# Patient Record
Sex: Male | Born: 1975 | Race: White | Hispanic: No | Marital: Married | Smoking: Never smoker
Health system: Southern US, Community
[De-identification: ages and names within clinical notes are randomized; demographics above are authoritative.]

## PROBLEM LIST (undated history)

## (undated) DIAGNOSIS — B019 Varicella without complication: Secondary | ICD-10-CM

## (undated) DIAGNOSIS — G610 Guillain-Barre syndrome: Secondary | ICD-10-CM

## (undated) DIAGNOSIS — R112 Nausea with vomiting, unspecified: Secondary | ICD-10-CM

## (undated) DIAGNOSIS — Z9889 Other specified postprocedural states: Secondary | ICD-10-CM

## (undated) DIAGNOSIS — E785 Hyperlipidemia, unspecified: Secondary | ICD-10-CM

## (undated) HISTORY — DX: Varicella without complication: B01.9

## (undated) HISTORY — DX: Hyperlipidemia, unspecified: E78.5

## (undated) HISTORY — PX: SHOULDER ARTHROSCOPY: SHX128

## (undated) HISTORY — PX: VASECTOMY: SHX75

## (undated) HISTORY — DX: Nausea with vomiting, unspecified: R11.2

## (undated) HISTORY — PX: WISDOM TOOTH EXTRACTION: SHX21

## (undated) HISTORY — PX: OTHER SURGICAL HISTORY: SHX169

## (undated) HISTORY — DX: Guillain-Barre syndrome: G61.0

## (undated) HISTORY — DX: Other specified postprocedural states: Z98.890

---

## 2009-08-07 ENCOUNTER — Ambulatory Visit: Payer: Self-pay | Admitting: Family Medicine

## 2010-03-12 ENCOUNTER — Ambulatory Visit: Payer: Self-pay | Admitting: Family Medicine

## 2010-09-21 ENCOUNTER — Ambulatory Visit (INDEPENDENT_AMBULATORY_CARE_PROVIDER_SITE_OTHER): Payer: Managed Care, Other (non HMO) | Admitting: Family Medicine

## 2010-09-21 DIAGNOSIS — B360 Pityriasis versicolor: Secondary | ICD-10-CM

## 2010-09-21 DIAGNOSIS — Z79899 Other long term (current) drug therapy: Secondary | ICD-10-CM

## 2012-05-21 ENCOUNTER — Ambulatory Visit (INDEPENDENT_AMBULATORY_CARE_PROVIDER_SITE_OTHER): Payer: Managed Care, Other (non HMO) | Admitting: Family Medicine

## 2012-05-21 ENCOUNTER — Encounter: Payer: Self-pay | Admitting: Family Medicine

## 2012-05-21 VITALS — BP 120/82 | HR 79 | Temp 100.3°F | Wt 226.0 lb

## 2012-05-21 DIAGNOSIS — J029 Acute pharyngitis, unspecified: Secondary | ICD-10-CM

## 2012-05-21 DIAGNOSIS — J019 Acute sinusitis, unspecified: Secondary | ICD-10-CM

## 2012-05-21 LAB — POCT RAPID STREP A (OFFICE): Rapid Strep A Screen: NEGATIVE

## 2012-05-21 MED ORDER — AMOXICILLIN 875 MG PO TABS: 875.0000 mg | ORAL_TABLET | Freq: Two times a day (BID) | ORAL | Status: DC

## 2012-05-21 NOTE — Patient Instructions (Signed)
Tylenol for aches and pains. Call after 10 days if not totally back to normal

## 2012-05-21 NOTE — Progress Notes (Signed)
  Subjective:    Patient ID: Riley Garcia, male    DOB: 1975-10-26, 36 y.o.   MRN: [id number]  HPI He has a one-day history that started with a sore throat, PND that is now purulent, slight chills with sinus pressure no earache, cough, chest congestion. He does not smoke. He has no underlying allergies   Review of Systems     Objective:   Physical Exam alert and in no distress. Tympanic membranes and canals are normal. Throat is clear. Tonsils are normal. Neck is supple without adenopathy or thyromegaly. Cardiac exam shows a regular sinus rhythm without murmurs or gallops. Lungs are clear to auscultation. Strep screen is negative       Assessment & Plan:   1. Sore throat  POCT rapid strep A  2. Acute sinusitis  amoxicillin (AMOXIL) 875 MG tablet

## 2012-05-22 ENCOUNTER — Telehealth: Payer: Self-pay

## 2012-05-22 ENCOUNTER — Ambulatory Visit
Admission: RE | Admit: 2012-05-22 | Discharge: 2012-05-22 | Disposition: A | Discharge status: Home / Self Care | Payer: Managed Care, Other (non HMO) | Source: Ambulatory Visit | Attending: Family Medicine | Admitting: Family Medicine

## 2012-05-22 ENCOUNTER — Other Ambulatory Visit: Payer: Self-pay

## 2012-05-22 DIAGNOSIS — R05 Cough: Secondary | ICD-10-CM

## 2012-05-22 NOTE — Progress Notes (Signed)
Quick Note:  Patient was informed of normal chest x-ray. He did have some episodes of hemoptysis. ______

## 2012-05-22 NOTE — Telephone Encounter (Signed)
PT WAS CALLED AND TOLD TO GO TO EITHER [facility] TO HAVE CHEST X RAY PT VERBALIZED UNDERSTANDING

## 2013-11-18 ENCOUNTER — Encounter: Payer: Self-pay | Admitting: Family Medicine

## 2013-11-18 ENCOUNTER — Ambulatory Visit (INDEPENDENT_AMBULATORY_CARE_PROVIDER_SITE_OTHER): Payer: Managed Care, Other (non HMO) | Admitting: Family Medicine

## 2013-11-18 VITALS — Wt 223.0 lb

## 2013-11-18 DIAGNOSIS — L0390 Cellulitis, unspecified: Secondary | ICD-10-CM

## 2013-11-18 DIAGNOSIS — L0291 Cutaneous abscess, unspecified: Secondary | ICD-10-CM

## 2013-11-18 MED ORDER — DOXYCYCLINE HYCLATE 100 MG PO TABS: 100.0000 mg | ORAL_TABLET | Freq: Two times a day (BID) | ORAL | Status: DC

## 2013-11-18 NOTE — Patient Instructions (Signed)
Take all the antibiotic and keep me informed if it gets worse

## 2013-11-18 NOTE — Progress Notes (Signed)
   Subjective:    Patient ID: Riley Garcia, male    DOB: 08-Feb-1976, 38 y.o.   MRN: [id number]  HPI 3 days ago he noted a small lesion on his right inner upper arm. Yesterday he noted some surrounding erythema warmth and tenderness.   Review of Systems     Objective:   Physical Exam Alert and in no distress. Exam of the right upper medial arm does show a central healing lesion with surrounding erythema of approximately 3-4 cm       Assessment & Plan:  Cellulitis - Plan: doxycycline (VIBRA-TABS) 100 MG tablet  the area of cellulitis was marked with bending 10. He will keep me informed as to the progress.

## 2013-11-19 ENCOUNTER — Ambulatory Visit: Payer: Self-pay | Admitting: Family Medicine

## 2014-05-01 ENCOUNTER — Other Ambulatory Visit: Payer: Self-pay | Admitting: Orthopedic Surgery

## 2014-05-01 DIAGNOSIS — M25511 Pain in right shoulder: Secondary | ICD-10-CM

## 2014-05-19 ENCOUNTER — Ambulatory Visit
Admission: RE | Admit: 2014-05-19 | Discharge: 2014-05-19 | Disposition: A | Discharge status: Home / Self Care | Payer: Managed Care, Other (non HMO) | Source: Ambulatory Visit | Attending: Orthopedic Surgery | Admitting: Orthopedic Surgery

## 2014-05-19 DIAGNOSIS — M25511 Pain in right shoulder: Secondary | ICD-10-CM

## 2014-05-19 MED ORDER — IOHEXOL 180 MG/ML  SOLN
13.0000 mL | Freq: Once | INTRAMUSCULAR | Status: AC | PRN
  Administered 2014-05-19: 13 mL via INTRA_ARTICULAR

## 2014-05-20 HISTORY — PX: SHOULDER ARTHROSCOPY: SHX128

## 2014-05-31 ENCOUNTER — Other Ambulatory Visit: Payer: Self-pay | Admitting: Family Medicine

## 2014-05-31 MED ORDER — CIPROFLOXACIN HCL 0.3 % OP SOLN: 2.0000 [drp] | Freq: Three times a day (TID) | OPHTHALMIC | Status: DC

## 2016-06-02 ENCOUNTER — Ambulatory Visit (INDEPENDENT_AMBULATORY_CARE_PROVIDER_SITE_OTHER): Payer: Managed Care, Other (non HMO) | Admitting: Family Medicine

## 2016-06-02 VITALS — BP 112/80 | HR 63 | Temp 99.9°F | Wt 228.0 lb

## 2016-06-02 DIAGNOSIS — J029 Acute pharyngitis, unspecified: Secondary | ICD-10-CM | POA: Diagnosis not present

## 2016-06-02 LAB — POCT RAPID STREP A (OFFICE): Rapid Strep A Screen: NEGATIVE

## 2016-06-02 NOTE — Progress Notes (Signed)
   Subjective:    Patient ID: Riley Garcia, male    DOB: [birth year], 40 y.o.   MRN: [id number]  HPI Approximate 4 days ago he had difficulty with malaise followed by myalgias, sore throat, dry cough, nasal congestion. He has been using OTC meds to help with this.   Review of Systems     Objective:   Physical Exam Alert and in no distress. Tympanic membranes and canals are normal. Pharyngeal area is normal. Neck is supple without adenopathy or thyromegaly. Cardiac exam shows a regular sinus rhythm without murmurs or gallops. Lungs are clear to auscultation. Strep screen is negative       Assessment & Plan:  Sore throat - Plan: POCT rapid strep A I explained that he has a viral illness and recommended supportive care. He is comfortable with that.

## 2016-11-10 ENCOUNTER — Encounter: Payer: Self-pay | Admitting: Family Medicine

## 2016-11-10 ENCOUNTER — Ambulatory Visit (INDEPENDENT_AMBULATORY_CARE_PROVIDER_SITE_OTHER): Payer: Managed Care, Other (non HMO) | Admitting: Family Medicine

## 2016-11-10 VITALS — BP 128/78 | HR 51 | Temp 98.3°F | Ht 74.75 in | Wt 199.8 lb

## 2016-11-10 DIAGNOSIS — R42 Dizziness and giddiness: Secondary | ICD-10-CM | POA: Diagnosis not present

## 2016-11-10 DIAGNOSIS — B360 Pityriasis versicolor: Secondary | ICD-10-CM | POA: Insufficient documentation

## 2016-11-10 DIAGNOSIS — R001 Bradycardia, unspecified: Secondary | ICD-10-CM

## 2016-11-10 DIAGNOSIS — Z23 Encounter for immunization: Secondary | ICD-10-CM | POA: Diagnosis not present

## 2016-11-10 DIAGNOSIS — Z0001 Encounter for general adult medical examination with abnormal findings: Secondary | ICD-10-CM | POA: Diagnosis not present

## 2016-11-10 DIAGNOSIS — Z13220 Encounter for screening for lipoid disorders: Secondary | ICD-10-CM | POA: Diagnosis not present

## 2016-11-10 NOTE — Patient Instructions (Addendum)
Tdap today  EKG appears to show athletic changes only but I am going to likely get Dr. Paulla Fore or one of the cardiologists to take a look at this as well just for another opinion.   Schedule a lab visit at the check out desk within 2 weeks. Return for future fasting labs meaning nothing but water after midnight please. Ok to take your medications with water.

## 2016-11-10 NOTE — Progress Notes (Signed)
Phone: [phone]  Subjective:  Patient presents today to establish care.  Prior patient of Dr. Redmond [employer] but only for acute issues. Chief complaint-noted.   See problem oriented charting  The following were reviewed and entered/updated in epic: Past Medical History:  Diagnosis Date  . Chicken pox   . Guillain Barr syndrome [facility])    sophomore year of college. never went past shins and into hands.    Patient Active Problem List   Diagnosis Date Noted  . Tinea versicolor 11/10/2016    Priority: Low   Past Surgical History:  Procedure Laterality Date  . SHOULDER ARTHROSCOPY Right    december 2015  . VASECTOMY      Family History  Problem Relation Age of Onset  . Healthy Mother   . Hypertension Father   . Diabetes Father        Type II  . Heart disease Father        arrhythmia- 3 ablations. not sure which type.   Marland Kitchen Healthy Sister   . Multiple sclerosis Brother   . Healthy Daughter   . Alcohol abuse Paternal Aunt   . Alcohol abuse Paternal Uncle   . Heart attack Paternal Uncle        after age 31   . Alcohol abuse Paternal Grandmother        passed age 60  . Heart disease Paternal Grandmother        thinks had heart attack  . Hyperlipidemia Paternal Grandfather   . Early death Paternal Grandfather        42 from heart attack  . Hypertension Paternal Grandfather   . Healthy Daughter     Medications- reviewed and updated No current outpatient prescriptions on file.   No current facility-administered medications for this visit.     Allergies-reviewed and updated No Known Allergies  Social History   Social History  . Marital status: Married    Spouse name: N/A  . Number of children: N/A  . Years of education: N/A   Social History Main Topics  . Smoking status: Never Smoker  . Smokeless tobacco: Former Systems developer    Types: Chew    Quit date: 2010  . Alcohol use 1.8 - 2.4 oz/week    3 - 4 Standard drinks or equivalent per week     Comment: Social  . Drug  use: No  . Sexual activity: Yes   Other Topics Concern  . None   Social History Narrative   Married. 2 children 17 and 34 year old daughters 10/2016- grimsley and kiser.    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       Hobbies: cycling, rare golf. Enjoys the outdoors. Working in the yard.     ROS--Full ROS was completed Review of Systems  Constitutional: Negative for chills and fever.  HENT: Negative for hearing loss and tinnitus.   Eyes: Negative for blurred vision and double vision.  Respiratory: Negative for cough, hemoptysis, shortness of breath and wheezing.   Cardiovascular: Negative for chest pain and palpitations.  Gastrointestinal: Negative for heartburn and nausea.  Genitourinary: Negative for dysuria and urgency.  Musculoskeletal: Negative for myalgias and neck pain.  Skin: Positive for rash (intermittent issues tinea versicolor- sees dermatology). Negative for itching.  Neurological: Negative for dizziness and headaches.  Endo/Heme/Allergies: Negative for polydipsia. Does not bruise/bleed easily.  Psychiatric/Behavioral: Negative for hallucinations and substance abuse.   Objective: BP 128/78 (BP Location: Left Arm, Patient Position: Sitting, Cuff Size: Large)   Pulse (!) 51   Temp 98.3 F (36.8 C) (Oral)   Ht 6' 2.75" (1.899 m)   Wt 199 lb 12.8 oz (90.6 kg)   SpO2 97%   BMI 25.14 kg/m  Gen: NAD, resting comfortably, athletic build HEENT: Mucous membranes are moist. Oropharynx normal. TM normal. Eyes: sclera and lids normal, PERRLA Neck: no thyromegaly, no cervical lymphadenopathy CV: RRR no murmurs rubs or gallops Lungs: CTAB no crackles, wheeze, rhonchi Abdomen: soft/nontender/nondistended/normal bowel sounds. No rebound or guarding.  Ext: no edema Skin: warm, dry Neuro: 5/5 strength in upper and lower extremities, normal gait, normal  reflexes  Deferred rectal and GU  EKG:  1st degree block with marked bradycardia and rate of 37, normal axis, normal intervals other than PR. LVH criteria are met. No prior to compare. These are normal changes for an athlete of his level.   Assessment/Plan:  41 y.o. male presenting for annual physical.  Health Maintenance counseling: 1. Anticipatory guidance: Patient counseled regarding regular dental exams - q 6 months, eye exams  yearly, wearing seatbelts.  2. Risk factor reduction:  Advised patient of need for regular exercise and diet rich and fruits and vegetables to reduce risk of heart attack and stroke. Exercise- at least 6 days a week. Diet-lost 30 lbs in preparation for riding [place] [address] [place].  Wt Readings from Last 3 Encounters:  11/10/16 199 lb 12.8 oz (90.6 kg)  06/02/16 228 lb (103.4 kg)  11/18/13 223 lb (101.2 kg)  3. Immunizations/screenings/ancillary studies- Tdap today. G[facility] blood to [facility] so already screened for HIV 4. Prostate cancer screening- no family history, start at age 88 or 67  5. Colon cancer screening - no family history, start at age 29 6. Skin cancer screening/prevention- doing better with sunscreen use. Dr. Nevada Crane a few times for tinea versicolor- he also does skin checks 7. Testicular cancer screening- advised monthly self exams - no issues does regularly 8. STD screening- patient opts out   Status of chronic or acute concerns   June- [place] ride - total 470 miles. 95 miles a day. Will be seeing Dr. Paulla Fore for bike fit. Family history of heart disease in paternal uncles and grandparents. Bradycardia likely just athletic heart rate but will get EKG for patient comfort before long trip. Occasional lightheadedness after long workout resolves with electrolyte supplmeentation  EKG with LVH, 1st degree block, bradycardia- normal variants for an athlete. Confirmed this with Dr. Teresa Coombs of sports medicine who updated me on current  guidelines  1-2.5 year physical  Return for fasting labs.  Orders Placed This Encounter  Procedures  . CBC    Standing Status:   Future    Standing Expiration Date:   11/10/2017  . Comprehensive metabolic panel    [place]    Standing Status:   Future    Standing Expiration Date:   11/10/2017  . Lipid panel    Standing Status:   Future    Standing Expiration Date:   11/10/2017  . EKG 12-Lead    Order Specific Question:   Where should this test be performed    Answer:   Other   Return precautions advised.  Garret Reddish, MD

## 2016-11-16 ENCOUNTER — Other Ambulatory Visit: Payer: Self-pay

## 2016-11-16 ENCOUNTER — Other Ambulatory Visit (INDEPENDENT_AMBULATORY_CARE_PROVIDER_SITE_OTHER): Payer: Managed Care, Other (non HMO)

## 2016-11-16 DIAGNOSIS — R42 Dizziness and giddiness: Secondary | ICD-10-CM

## 2016-11-16 DIAGNOSIS — D72819 Decreased white blood cell count, unspecified: Secondary | ICD-10-CM

## 2016-11-16 DIAGNOSIS — Z0001 Encounter for general adult medical examination with abnormal findings: Secondary | ICD-10-CM | POA: Diagnosis not present

## 2016-11-16 DIAGNOSIS — Z13220 Encounter for screening for lipoid disorders: Secondary | ICD-10-CM

## 2016-11-16 LAB — CBC
HEMATOCRIT: 38.6 % — AB (ref 39.0–52.0)
Hemoglobin: 12.9 g/dL — ABNORMAL LOW (ref 13.0–17.0)
MCHC: 33.4 g/dL (ref 30.0–36.0)
MCV: 92.6 fl (ref 78.0–100.0)
Platelets: 182 10*3/uL (ref 150.0–400.0)
RBC: 4.17 Mil/uL — ABNORMAL LOW (ref 4.22–5.81)
RDW: 12.7 % (ref 11.5–15.5)
WBC: 3.4 10*3/uL — ABNORMAL LOW (ref 4.0–10.5)

## 2016-11-16 LAB — COMPREHENSIVE METABOLIC PANEL
ALBUMIN: 4.6 g/dL (ref 3.5–5.2)
ALT: 15 U/L (ref 0–53)
AST: 18 U/L (ref 0–37)
Alkaline Phosphatase: 47 U/L (ref 39–117)
BUN: 20 mg/dL (ref 6–23)
CO2: 32 mEq/L (ref 19–32)
Calcium: 9.5 mg/dL (ref 8.4–10.5)
Chloride: 102 mEq/L (ref 96–112)
Creatinine, Ser: 0.79 mg/dL (ref 0.40–1.50)
GFR: 115.5 mL/min (ref >60.00)
Glucose, Bld: 95 mg/dL (ref 70–99)
Potassium: 4.4 mEq/L (ref 3.5–5.1)
SODIUM: 139 meq/L (ref 135–145)
Total Bilirubin: 0.8 mg/dL (ref 0.2–1.2)
Total Protein: 6.7 g/dL (ref 6.0–8.3)

## 2016-11-16 LAB — LIPID PANEL
Cholesterol: 152 mg/dL (ref 0–200)
HDL: 73 mg/dL (ref >39.00)
LDL Cholesterol: 73 mg/dL (ref 0–99)
NonHDL: 79.42
Total CHOL/HDL Ratio: 2
Triglycerides: 33 mg/dL (ref 0.0–149.0)
VLDL: 6.6 mg/dL (ref 0.0–40.0)

## 2016-12-01 ENCOUNTER — Other Ambulatory Visit (INDEPENDENT_AMBULATORY_CARE_PROVIDER_SITE_OTHER): Payer: Managed Care, Other (non HMO)

## 2016-12-01 DIAGNOSIS — D72819 Decreased white blood cell count, unspecified: Secondary | ICD-10-CM

## 2016-12-01 LAB — CBC
HCT: 40.1 % (ref 39.0–52.0)
HEMOGLOBIN: 13.6 g/dL (ref 13.0–17.0)
MCHC: 33.8 g/dL (ref 30.0–36.0)
MCV: 91.9 fl (ref 78.0–100.0)
Platelets: 206 10*3/uL (ref 150.0–400.0)
RBC: 4.37 Mil/uL (ref 4.22–5.81)
RDW: 12.9 % (ref 11.5–15.5)
WBC: 4.4 10*3/uL (ref 4.0–10.5)

## 2017-01-05 ENCOUNTER — Ambulatory Visit (INDEPENDENT_AMBULATORY_CARE_PROVIDER_SITE_OTHER): Payer: Managed Care, Other (non HMO) | Admitting: Family Medicine

## 2017-01-05 ENCOUNTER — Encounter: Payer: Self-pay | Admitting: Family Medicine

## 2017-01-05 VITALS — BP 122/68 | HR 55 | Temp 98.2°F | Ht 74.75 in | Wt 195.6 lb

## 2017-01-05 DIAGNOSIS — R5383 Other fatigue: Secondary | ICD-10-CM

## 2017-01-05 DIAGNOSIS — R0789 Other chest pain: Secondary | ICD-10-CM | POA: Diagnosis not present

## 2017-01-05 LAB — CBC WITH DIFFERENTIAL/PLATELET
BASOS PCT: 0.8 % (ref 0.0–3.0)
Basophils Absolute: 0 10*3/uL (ref 0.0–0.1)
EOS PCT: 1.1 % (ref 0.0–5.0)
Eosinophils Absolute: 0.1 10*3/uL (ref 0.0–0.7)
HEMATOCRIT: 40.8 % (ref 39.0–52.0)
Hemoglobin: 13.7 g/dL (ref 13.0–17.0)
LYMPHS PCT: 30.5 % (ref 12.0–46.0)
Lymphs Abs: 1.4 10*3/uL (ref 0.7–4.0)
MCHC: 33.5 g/dL (ref 30.0–36.0)
MCV: 91.4 fl (ref 78.0–100.0)
Monocytes Absolute: 0.4 10*3/uL (ref 0.1–1.0)
Monocytes Relative: 9.4 % (ref 3.0–12.0)
Neutro Abs: 2.8 10*3/uL (ref 1.4–7.7)
Neutrophils Relative %: 58.2 % (ref 43.0–77.0)
Platelets: 249 10*3/uL (ref 150.0–400.0)
RBC: 4.47 Mil/uL (ref 4.22–5.81)
RDW: 12.5 % (ref 11.5–15.5)
WBC: 4.7 10*3/uL (ref 4.0–10.5)

## 2017-01-05 LAB — COMPREHENSIVE METABOLIC PANEL
ALBUMIN: 4.4 g/dL (ref 3.5–5.2)
ALT: 22 U/L (ref 0–53)
AST: 18 U/L (ref 0–37)
Alkaline Phosphatase: 54 U/L (ref 39–117)
BUN: 17 mg/dL (ref 6–23)
CALCIUM: 9.5 mg/dL (ref 8.4–10.5)
CHLORIDE: 100 meq/L (ref 96–112)
CO2: 34 mEq/L — ABNORMAL HIGH (ref 19–32)
CREATININE: 0.72 mg/dL (ref 0.40–1.50)
GFR: 128.46 mL/min (ref >60.00)
Glucose, Bld: 82 mg/dL (ref 70–99)
Potassium: 4.3 mEq/L (ref 3.5–5.1)
Sodium: 138 mEq/L (ref 135–145)
Total Bilirubin: 0.5 mg/dL (ref 0.2–1.2)
Total Protein: 6.6 g/dL (ref 6.0–8.3)

## 2017-01-05 LAB — TSH: TSH: 2.35 u[IU]/mL (ref 0.35–4.50)

## 2017-01-05 NOTE — Progress Notes (Addendum)
Subjective:  Salim Forero is a 41 y.o. year old very pleasant male patient who presents for/with See problem oriented charting ROS- fatigue, slight weight loss, some nausea-now improved. Had one episode of chest pain. No shortness of breath   Past Medical History-  Patient Active Problem List   Diagnosis Date Noted  . Tinea versicolor 11/10/2016    Priority: Low   Medications- reviewed and updated, None  Objective: BP 122/68 (BP Location: Left Arm, Patient Position: Sitting, Cuff Size: Large)   Pulse (!) 55   Temp 98.2 F (36.8 C) (Oral)   Ht 6' 2.75" (1.899 m)   Wt 195 lb 9.6 oz (88.7 kg)   SpO2 96%   BMI 24.61 kg/m  Gen: NAD, resting comfortably. Well-appearing CV: RRR no murmurs rubs or gallops No chest wall tenderness Lungs: CTAB no crackles, wheeze, rhonchi Abdomen: soft/nontender/nondistended/normal bowel sounds. No rebound or guarding.  Ext: no edema Skin: warm, dry  EKG: first-degree block with PR 202 with rate of 41, outside of PR interval -normal intervals, no hypertrophy or ST or T-wave changes. Prior noted left ventricular hypertrophy is no longer noted. EKG compared to May EKG and otherwise stable.  Assessment/Plan:  Atypical chest pain - Plan: EKG 12-Lead  Fatigue, unspecified type - Plan: CBC with Differential/Platelet, Comprehensive metabolic panel, TSH S: patient reports fatigue starting around July 6th. Had done a hard bike ride on the 4th- did well on his prolonged [place] hike. Saturday morning before leaving- Had loose bowel movement that morning while on vacation domestically. Later that evening- Felt fatigued, tired, headache, very mild chest pain. Was diffuse tightness feeling withotu shortness of breath, left arm or neck pain or diaphoresis That evening had a lot of diarrhea continuing into that Sunday then started to eat better but still had some nausea. On Monday started to feel better and didn't note any fatigue other than possibly lower energy when  hiking. Had lost about 5 lbs during the trip.none of family was ill- didn't eat anything different from family.   Since getting back has been very fatigued- wife and family told him he "looked like crap". Sleeping a lot more than normal- napping whenever he can.   No more chest pain since early in vacation. No shortness of breath. Fatigued more than normal with biking. No melena or BRBPR- stools lighter than normal  Cardiac history: PGF died 7 from MI. Paternal uncles in 21s with MI A/P: We updated an EKG due to strong family history of cardiac disease and chest pain that he had previously. EKG was reassuring. I think the most likely cause of fatigue was a GI/viral illness with some lingering fatigue. We will update labs as noted. With his family history of cardiac disease, we discussed if labs unrevealing and symptoms continue that we will refer to cardiology. A lot of this is for peace of mind for patient due to strong family history. 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 CBC with Differential/Platelet  . Comprehensive  metabolic panel    [place]  . TSH    [place]  . EKG 12-Lead    Order Specific Question:   Where should this test be performed    Answer:   Other   Return precautions advised.  Garret Reddish, MD

## 2017-01-05 NOTE — Patient Instructions (Signed)
EKG unchanged  Update labs- may give [place] some direction  If no clear cause on labs and symptoms persist another week- would consider cardiology consult though I do not have a strong suspicion this is related to your heart

## 2017-01-10 ENCOUNTER — Encounter: Payer: Self-pay | Admitting: Family Medicine

## 2017-11-06 ENCOUNTER — Other Ambulatory Visit: Payer: Self-pay | Admitting: Family Medicine

## 2017-11-06 MED ORDER — PREDNISONE 10 MG (48) PO TBPK: ORAL_TABLET | ORAL | 0 refills | Status: DC

## 2018-12-20 ENCOUNTER — Other Ambulatory Visit: Payer: Self-pay

## 2018-12-20 ENCOUNTER — Encounter: Payer: Self-pay | Admitting: Family Medicine

## 2018-12-20 ENCOUNTER — Ambulatory Visit (INDEPENDENT_AMBULATORY_CARE_PROVIDER_SITE_OTHER): Payer: Managed Care, Other (non HMO) | Admitting: Family Medicine

## 2018-12-20 VITALS — BP 118/72 | HR 64 | Temp 98.8°F | Ht 73.75 in | Wt 202.4 lb

## 2018-12-20 DIAGNOSIS — Z0000 Encounter for general adult medical examination without abnormal findings: Secondary | ICD-10-CM | POA: Diagnosis not present

## 2018-12-20 DIAGNOSIS — Z13220 Encounter for screening for lipoid disorders: Secondary | ICD-10-CM | POA: Diagnosis not present

## 2018-12-20 DIAGNOSIS — R5383 Other fatigue: Secondary | ICD-10-CM | POA: Diagnosis not present

## 2018-12-20 DIAGNOSIS — Z8669 Personal history of other diseases of the nervous system and sense organs: Secondary | ICD-10-CM | POA: Diagnosis not present

## 2018-12-20 DIAGNOSIS — E663 Overweight: Secondary | ICD-10-CM

## 2018-12-20 NOTE — Patient Instructions (Addendum)
We briefly discussed coronary CT at some point in the future  Please stop by lab before you go If you do not have mychart- we will call you about results within 5 business days of [place] receiving them.  If you have mychart- we will send your results within 3 business days of [place] receiving them.  If abnormal or we want to clarify a result, we will call or mychart you to make sure you receive the message.  If you have questions or concerns or don't hear within 5-7 days, please send [place] a message or call us.   I think you are doing great!

## 2018-12-20 NOTE — Progress Notes (Signed)
Phone: [phone]    Subjective:  Patient presents today for their annual physical. Chief complaint-noted.   See problem oriented charting- ROS- full  review of systems was completed and negative except for: mild fatigue at times after hard ride  The following were reviewed and entered/updated in epic: Past Medical History:  Diagnosis Date  . Chicken pox   . Guillain Barr syndrome [facility])    sophomore year of college. never went past shins and into hands.    Patient Active Problem List   Diagnosis Date Noted  . History of Guillain-Barre syndrome 12/20/2018    Priority: Low  . Tinea versicolor 11/10/2016    Priority: Low   Past Surgical History:  Procedure Laterality Date  . SHOULDER ARTHROSCOPY Right    december 2015  . VASECTOMY      Family History  Problem Relation Age of Onset  . Healthy Mother   . Hypertension Father   . Diabetes Father        Type II  . Heart disease Father        arrhythmia- 3 ablations. not sure which type.   Marland Kitchen Healthy Sister   . Multiple sclerosis Brother   . Healthy Daughter   . Alcohol abuse Paternal Aunt   . Alcohol abuse Paternal Uncle   . Heart attack Paternal Uncle        after age 55   . Alcohol abuse Paternal Grandmother        passed age 13  . Heart disease Paternal Grandmother        thinks had heart attack  . Hyperlipidemia Paternal Grandfather   . Early death Paternal Grandfather        2 from heart attack  . Hypertension Paternal Grandfather   . Healthy Daughter     Medications- reviewed and updated No current outpatient medications on file.   No current facility-administered medications for this visit.     Allergies-reviewed and updated No Known Allergies  Social History   Social History Narrative   Married. 2 children 36 and 69 year old daughters 10/2016- grimsley and kiser.    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       Hobbies: cycling, rare golf. Enjoys the outdoors. Working in the yard.       Objective:  BP 118/72   Pulse 64   Temp 98.8 F (37.1 C) (Oral)   Ht 6' 1.75" (1.873 m)   Wt 202 lb 6.4 oz (91.8 kg)   SpO2 97%   BMI 26.16 kg/m  Gen: NAD, resting comfortably, athletic build HEENT: Mucous membranes are moist. Oropharynx normal Neck: no thyromegaly CV: RRR no murmurs rubs or gallops Lungs: CTAB no crackles, wheeze, rhonchi Abdomen: soft/nontender/nondistended/normal bowel sounds. No rebound or guarding.  Ext: no edema and 2+ PT pulses Skin: warm, dry Neuro: grossly normal, moves all extremities, PERRLA     Assessment and Plan:  43 y.o. male presenting for annual physical.  Health Maintenance counseling: 1. Anticipatory guidance: Patient counseled regarding regular dental exams -q6 months, eye exams - yearly (wears glasses for night),  avoiding smoking and second hand smoke , limiting alcohol to 2 beverages per day.   2. Risk factor reduction:  Advised patient of need for regular exercise and diet rich and fruits and vegetables to reduce risk of heart attack and stroke. Exercise-  averaging 250 miles a week over last 2 months. Diet- trying to do fresh meats/veggies and limit carbs. Overweight by BMI but has muscular/athletic build so this overestimates for him- normal/healthy weight in my opinion- really in superb shape.  Wt Readings from Last 3 Encounters:  12/20/18 202 lb 6.4 oz (91.8 kg)  01/05/17 195 lb 9.6 oz (88.7 kg)  11/10/16 199 lb 12.8 oz (90.6 kg)  3. Immunizations/screenings/ancillary studies Immunization History  Administered Date(s) Administered  . Tdap 11/10/2016   Health Maintenance Due  Topic Date Due  . HIV Screening-already screened as donates blood to [employer] 12/10/1991   4. Prostate cancer screening- no family history, likely start at 64  5. Colon cancer screening - no family history, start at age 37 6. Skin cancer  screening/prevention- has seen Dr. Nevada Crane in past. advised regular sunscreen use- feels like he could improve this. Denies worrisome, changing, or new skin lesions.  7. Testicular cancer screening- advised monthly self exams  8. STD screening- patient opts out as monogomous 9. Never smoker  Status of chronic or acute concerns   No issues after last visit after about a week- prior fatigue and chest issues resolved after super long bike ride.   Wants to hold off on any coronary evaluation such as coronary CT. Screen lipids again with family history- LDL close to ideal goal of 70 or less at 74.   Mild fatigue- check cbc, cmp- mainly after hard rides which is normal  1-2.5 year CPE advised Lab/Order associations:NOT fasting    ICD-10-CM   1. Preventative health care  Z00.00 Lipid panel    Comprehensive metabolic panel    CBC    CBC    Comprehensive metabolic panel    Lipid panel    CANCELED: CBC    CANCELED: Comprehensive metabolic panel    CANCELED: Lipid panel  2. Screening for hyperlipidemia  Z13.220 Lipid panel    Lipid panel    CANCELED: Lipid panel  3. Fatigue, unspecified type  R53.83 Comprehensive metabolic panel    CBC    CBC    Comprehensive metabolic panel    CANCELED: CBC    CANCELED: Comprehensive metabolic panel  4. History of Guillain-Barre syndrome  Z86.69    Return precautions advised.  Garret Reddish, MD

## 2018-12-21 LAB — LIPID PANEL
Cholesterol: 173 mg/dL (ref <200)
HDL: 85 mg/dL (ref >=40)
LDL Cholesterol (Calc): 71 mg/dL (calc)
Non-HDL Cholesterol (Calc): 88 mg/dL (calc) (ref <130)
Total CHOL/HDL Ratio: 2 (calc) (ref <5.0)
Triglycerides: 85 mg/dL (ref <150)

## 2018-12-21 LAB — COMPREHENSIVE METABOLIC PANEL
AG Ratio: 2.1 (calc) (ref 1.0–2.5)
ALT: 11 U/L (ref 9–46)
AST: 16 U/L (ref 10–40)
Albumin: 4.6 g/dL (ref 3.6–5.1)
Alkaline phosphatase (APISO): 58 U/L (ref 36–130)
BUN: 22 mg/dL (ref 7–25)
CO2: 29 mmol/L (ref 20–32)
Calcium: 9.4 mg/dL (ref 8.6–10.3)
Chloride: 102 mmol/L (ref 98–110)
Creat: 0.8 mg/dL (ref 0.60–1.35)
Globulin: 2.2 g/dL (calc) (ref 1.9–3.7)
Glucose, Bld: 95 mg/dL (ref 65–99)
Potassium: 4.1 mmol/L (ref 3.5–5.3)
Sodium: 138 mmol/L (ref 135–146)
Total Bilirubin: 0.7 mg/dL (ref 0.2–1.2)
Total Protein: 6.8 g/dL (ref 6.1–8.1)

## 2018-12-21 LAB — CBC
HCT: 38.4 % — ABNORMAL LOW (ref 38.5–50.0)
Hemoglobin: 13 g/dL — ABNORMAL LOW (ref 13.2–17.1)
MCH: 31 pg (ref 27.0–33.0)
MCHC: 33.9 g/dL (ref 32.0–36.0)
MCV: 91.6 fL (ref 80.0–100.0)
MPV: 12.1 fL (ref 7.5–12.5)
Platelets: 218 10*3/uL (ref 140–400)
RBC: 4.19 10*6/uL — ABNORMAL LOW (ref 4.20–5.80)
RDW: 11.6 % (ref 11.0–15.0)
WBC: 5.8 10*3/uL (ref 3.8–10.8)

## 2019-09-11 ENCOUNTER — Encounter: Payer: Self-pay | Admitting: Family Medicine

## 2020-05-20 HISTORY — PX: CHEST TUBE INSERTION: SHX231

## 2020-06-05 ENCOUNTER — Emergency Department (HOSPITAL_COMMUNITY): Payer: BC Managed Care – PPO

## 2020-06-05 ENCOUNTER — Other Ambulatory Visit: Payer: Self-pay

## 2020-06-05 ENCOUNTER — Inpatient Hospital Stay (HOSPITAL_COMMUNITY): Payer: BC Managed Care – PPO

## 2020-06-05 ENCOUNTER — Encounter (HOSPITAL_COMMUNITY): Payer: Self-pay | Admitting: *Deleted

## 2020-06-05 ENCOUNTER — Inpatient Hospital Stay (HOSPITAL_COMMUNITY)
Admission: EM | Admit: 2020-06-05 | Discharge: 2020-06-08 | DRG: 200 | Disposition: A | Discharge status: Home / Self Care | Payer: BC Managed Care – PPO | Attending: Surgery | Admitting: Surgery

## 2020-06-05 DIAGNOSIS — M25512 Pain in left shoulder: Secondary | ICD-10-CM | POA: Diagnosis present

## 2020-06-05 DIAGNOSIS — M25552 Pain in left hip: Secondary | ICD-10-CM | POA: Diagnosis present

## 2020-06-05 DIAGNOSIS — R52 Pain, unspecified: Secondary | ICD-10-CM

## 2020-06-05 DIAGNOSIS — L0390 Cellulitis, unspecified: Secondary | ICD-10-CM | POA: Diagnosis not present

## 2020-06-05 DIAGNOSIS — S2232XA Fracture of one rib, left side, initial encounter for closed fracture: Secondary | ICD-10-CM | POA: Diagnosis present

## 2020-06-05 DIAGNOSIS — R519 Headache, unspecified: Secondary | ICD-10-CM | POA: Diagnosis present

## 2020-06-05 DIAGNOSIS — Z9689 Presence of other specified functional implants: Secondary | ICD-10-CM

## 2020-06-05 DIAGNOSIS — Z8249 Family history of ischemic heart disease and other diseases of the circulatory system: Secondary | ICD-10-CM

## 2020-06-05 DIAGNOSIS — Z23 Encounter for immunization: Secondary | ICD-10-CM | POA: Diagnosis not present

## 2020-06-05 DIAGNOSIS — Z20822 Contact with and (suspected) exposure to covid-19: Secondary | ICD-10-CM | POA: Diagnosis present

## 2020-06-05 DIAGNOSIS — J939 Pneumothorax, unspecified: Secondary | ICD-10-CM | POA: Diagnosis present

## 2020-06-05 DIAGNOSIS — R9431 Abnormal electrocardiogram [ECG] [EKG]: Secondary | ICD-10-CM | POA: Diagnosis present

## 2020-06-05 DIAGNOSIS — Z4682 Encounter for fitting and adjustment of non-vascular catheter: Secondary | ICD-10-CM

## 2020-06-05 DIAGNOSIS — S270XXA Traumatic pneumothorax, initial encounter: Principal | ICD-10-CM

## 2020-06-05 DIAGNOSIS — M79642 Pain in left hand: Secondary | ICD-10-CM | POA: Diagnosis present

## 2020-06-05 DIAGNOSIS — S51812A Laceration without foreign body of left forearm, initial encounter: Secondary | ICD-10-CM | POA: Diagnosis present

## 2020-06-05 DIAGNOSIS — S51012A Laceration without foreign body of left elbow, initial encounter: Secondary | ICD-10-CM | POA: Diagnosis present

## 2020-06-05 LAB — RESP PANEL BY RT-PCR (FLU A&B, COVID) ARPGX2
Influenza A by PCR: NEGATIVE
Influenza B by PCR: NEGATIVE
SARS Coronavirus 2 by RT PCR: NEGATIVE

## 2020-06-05 LAB — CBC WITH DIFFERENTIAL/PLATELET
Abs Immature Granulocytes: 0.03 10*3/uL (ref 0.00–0.07)
Basophils Absolute: 0 10*3/uL (ref 0.0–0.1)
Basophils Relative: 0 %
Eosinophils Absolute: 0.1 10*3/uL (ref 0.0–0.5)
Eosinophils Relative: 1 %
HCT: 42.4 % (ref 39.0–52.0)
Hemoglobin: 13.7 g/dL (ref 13.0–17.0)
Immature Granulocytes: 0 %
Lymphocytes Relative: 26 %
Lymphs Abs: 1.9 10*3/uL (ref 0.7–4.0)
MCH: 31.1 pg (ref 26.0–34.0)
MCHC: 32.3 g/dL (ref 30.0–36.0)
MCV: 96.1 fL (ref 80.0–100.0)
Monocytes Absolute: 0.5 10*3/uL (ref 0.1–1.0)
Monocytes Relative: 7 %
Neutro Abs: 4.7 10*3/uL (ref 1.7–7.7)
Neutrophils Relative %: 66 %
Platelets: 208 10*3/uL (ref 150–400)
RBC: 4.41 MIL/uL (ref 4.22–5.81)
RDW: 11.7 % (ref 11.5–15.5)
WBC: 7.2 10*3/uL (ref 4.0–10.5)
nRBC: 0 % (ref 0.0–0.2)

## 2020-06-05 LAB — BASIC METABOLIC PANEL
Anion gap: 12 (ref 5–15)
BUN: 22 mg/dL — ABNORMAL HIGH (ref 6–20)
CO2: 28 mmol/L (ref 22–32)
Calcium: 9.8 mg/dL (ref 8.9–10.3)
Chloride: 100 mmol/L (ref 98–111)
Creatinine, Ser: 0.78 mg/dL (ref 0.61–1.24)
GFR, Estimated: >60 mL/min (ref >60)
Glucose, Bld: 137 mg/dL — ABNORMAL HIGH (ref 70–99)
Potassium: 4.2 mmol/L (ref 3.5–5.1)
Sodium: 140 mmol/L (ref 135–145)

## 2020-06-05 LAB — HEPATIC FUNCTION PANEL
ALT: 18 U/L (ref 0–44)
AST: 26 U/L (ref 15–41)
Albumin: 4.3 g/dL (ref 3.5–5.0)
Alkaline Phosphatase: 50 U/L (ref 38–126)
Bilirubin, Direct: 0.1 mg/dL (ref 0.0–0.2)
Indirect Bilirubin: 1.1 mg/dL — ABNORMAL HIGH (ref 0.3–0.9)
Total Bilirubin: 1.2 mg/dL (ref 0.3–1.2)
Total Protein: 6.9 g/dL (ref 6.5–8.1)

## 2020-06-05 MED ORDER — MORPHINE SULFATE (PF) 2 MG/ML IV SOLN: 2.0000 mg | INTRAVENOUS | Status: DC | PRN

## 2020-06-05 MED ORDER — LIDOCAINE HCL (PF) 1 % IJ SOLN
10.0000 mL | Freq: Once | INTRAMUSCULAR | Status: AC
  Administered 2020-06-05: 16:00:00 10 mL

## 2020-06-05 MED ORDER — IOHEXOL 300 MG/ML  SOLN
100.0000 mL | Freq: Once | INTRAMUSCULAR | Status: AC | PRN
  Administered 2020-06-05: 15:00:00 100 mL via INTRAVENOUS

## 2020-06-05 MED ORDER — METOPROLOL TARTRATE 5 MG/5ML IV SOLN: 5.0000 mg | Freq: Four times a day (QID) | INTRAVENOUS | Status: DC | PRN

## 2020-06-05 MED ORDER — MORPHINE SULFATE (PF) 2 MG/ML IV SOLN
2.0000 mg | Freq: Once | INTRAVENOUS | Status: AC
  Administered 2020-06-05: 16:00:00 2 mg via INTRAVENOUS
  Filled 2020-06-05: qty 1

## 2020-06-05 MED ORDER — LACTATED RINGERS IV SOLN: INTRAVENOUS | Status: DC

## 2020-06-05 MED ORDER — LIDOCAINE 5 % EX PTCH
1.0000 | MEDICATED_PATCH | CUTANEOUS | Status: DC
  Filled 2020-06-05 (×3): qty 1

## 2020-06-05 MED ORDER — KETOROLAC TROMETHAMINE 15 MG/ML IJ SOLN
30.0000 mg | Freq: Four times a day (QID) | INTRAMUSCULAR | Status: DC
  Administered 2020-06-05 – 2020-06-08 (×12): 30 mg via INTRAVENOUS
  Filled 2020-06-05 (×12): qty 2

## 2020-06-05 MED ORDER — ENOXAPARIN SODIUM 30 MG/0.3ML ~~LOC~~ SOLN
30.0000 mg | Freq: Two times a day (BID) | SUBCUTANEOUS | Status: DC
  Administered 2020-06-06 – 2020-06-07 (×4): 30 mg via SUBCUTANEOUS
  Filled 2020-06-05 (×5): qty 0.3

## 2020-06-05 MED ORDER — LIDOCAINE-EPINEPHRINE 1 %-1:100000 IJ SOLN
INTRAMUSCULAR | Status: AC
  Filled 2020-06-05: qty 1

## 2020-06-05 MED ORDER — METHOCARBAMOL 750 MG PO TABS
750.0000 mg | ORAL_TABLET | Freq: Four times a day (QID) | ORAL | Status: DC
  Administered 2020-06-05 – 2020-06-08 (×11): 750 mg via ORAL
  Filled 2020-06-05 (×9): qty 1
  Filled 2020-06-05: qty 2
  Filled 2020-06-05 (×2): qty 1

## 2020-06-05 MED ORDER — ONDANSETRON 4 MG PO TBDP: 4.0000 mg | ORAL_TABLET | Freq: Four times a day (QID) | ORAL | Status: DC | PRN

## 2020-06-05 MED ORDER — ACETAMINOPHEN 500 MG PO TABS
1000.0000 mg | ORAL_TABLET | Freq: Four times a day (QID) | ORAL | Status: DC
  Administered 2020-06-05 – 2020-06-08 (×10): 1000 mg via ORAL
  Filled 2020-06-05 (×10): qty 2

## 2020-06-05 MED ORDER — DOCUSATE SODIUM 100 MG PO CAPS
100.0000 mg | ORAL_CAPSULE | Freq: Two times a day (BID) | ORAL | Status: DC
  Filled 2020-06-05 (×5): qty 1

## 2020-06-05 MED ORDER — ACETAMINOPHEN 500 MG PO TABS: 1000.0000 mg | ORAL_TABLET | Freq: Three times a day (TID) | ORAL | Status: DC

## 2020-06-05 MED ORDER — FENTANYL CITRATE (PF) 100 MCG/2ML IJ SOLN
50.0000 ug | Freq: Once | INTRAMUSCULAR | Status: AC
  Administered 2020-06-05: 14:00:00 50 ug via INTRAVENOUS
  Filled 2020-06-05: qty 2

## 2020-06-05 MED ORDER — ONDANSETRON HCL 4 MG/2ML IJ SOLN: 4.0000 mg | Freq: Four times a day (QID) | INTRAMUSCULAR | Status: DC | PRN

## 2020-06-05 MED ORDER — SODIUM CHLORIDE 0.45 % IV SOLN: INTRAVENOUS | Status: DC

## 2020-06-05 MED ORDER — KETOROLAC TROMETHAMINE 15 MG/ML IJ SOLN: 15.0000 mg | Freq: Four times a day (QID) | INTRAMUSCULAR | Status: DC | PRN

## 2020-06-05 MED ORDER — TETANUS-DIPHTH-ACELL PERTUSSIS 5-2.5-18.5 LF-MCG/0.5 IM SUSY
0.5000 mL | PREFILLED_SYRINGE | Freq: Once | INTRAMUSCULAR | Status: AC
  Administered 2020-06-05: 16:00:00 0.5 mL via INTRAMUSCULAR
  Filled 2020-06-05: qty 0.5

## 2020-06-05 MED ORDER — POLYETHYLENE GLYCOL 3350 17 G PO PACK: 17.0000 g | PACK | Freq: Every day | ORAL | Status: DC | PRN

## 2020-06-05 MED ORDER — OXYCODONE HCL 5 MG PO TABS
5.0000 mg | ORAL_TABLET | ORAL | Status: DC | PRN
  Filled 2020-06-05: qty 2

## 2020-06-05 MED ORDER — LIDOCAINE HCL (PF) 1 % IJ SOLN
INTRAMUSCULAR | Status: AC
  Administered 2020-06-05: 17:00:00 5 mL
  Filled 2020-06-05: qty 5

## 2020-06-05 MED ORDER — METHOCARBAMOL 500 MG PO TABS: 500.0000 mg | ORAL_TABLET | Freq: Four times a day (QID) | ORAL | Status: DC | PRN
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
## 2020-06-05 NOTE — ED Notes (Signed)
Patient transported to CT 
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
## 2020-06-05 NOTE — Procedures (Signed)
PROCEDURE: LACERATION REPAIR  Performed by Jillyn Ledger, PA-C Consent: Informed consent, after discussion of the risks, benefits, and alternatives to the procedure, was obtained Location: Left forearm Length: 3cm Complexity: Simple interrupted  Distal CMS: Normal. No deficits. Neurovascularly intact. Anesthesia: 1% lidocaine Preparation: The wound was cleaned with betadine solution and irrigated with NS. The area was prepped and draped in the usual sterile fashion.  Exploration: The wound was explored and no foreign bodies were found. Procedure: The skin was closed with 5-0 prolene x 3 sutures. There was good approximation, In total, 3 sutures were used.  Post-Procedure: Good closure and hemostasis. The patient tolerated the procedure well and there were no complications. CSM remain intact. Post procedure dressing applied by RN.   Marland Kitchen
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
## 2020-06-05 NOTE — Procedures (Signed)
Insertion of Chest Tube Procedure Note  Riley Garcia  [id number]  1976-01-23  Date:06/05/20  Time:5:29 PM    Provider Performing: Jillyn Ledger   Procedure: Chest Tube Insertion [phone])  Indication(s) Pneumothorax  Consent Risks of the procedure as well as the alternatives and risks of each were explained to the patient and/or caregiver.  Consent for the procedure was obtained and is signed in the bedside chart  Anesthesia Topical only with 1% lidocaine    Time Out Verified patient identification, verified procedure, site/side was marked, verified correct patient position, special equipment/implants available, medications/allergies/relevant history reviewed, required imaging and test results available.   Sterile Technique Maximal sterile technique including full sterile barrier drape, hand hygiene, sterile gown, sterile gloves, mask.  Procedure Description Area of placement cleaned and draped in sterile fashion.  A pigtail pleural catheter was placed into the left pleural space using Seldinger technique. Appropriate return of air was obtained.  The tube was connected to atrium and placed on -20 cm H2O wall suction.   Complications/Tolerance None; patient tolerated the procedure well. Chest X-ray is ordered to verify placement.   EBL Minimal  Specimen(s) none

## 2020-06-06 ENCOUNTER — Inpatient Hospital Stay (HOSPITAL_COMMUNITY): Payer: BC Managed Care – PPO

## 2020-06-06 ENCOUNTER — Encounter (HOSPITAL_COMMUNITY): Payer: Self-pay

## 2020-06-06 DIAGNOSIS — R9431 Abnormal electrocardiogram [ECG] [EKG]: Secondary | ICD-10-CM

## 2020-06-06 LAB — BASIC METABOLIC PANEL
Anion gap: 6 (ref 5–15)
BUN: 19 mg/dL (ref 6–20)
CO2: 29 mmol/L (ref 22–32)
Calcium: 8.9 mg/dL (ref 8.9–10.3)
Chloride: 101 mmol/L (ref 98–111)
Creatinine, Ser: 0.73 mg/dL (ref 0.61–1.24)
GFR, Estimated: >60 mL/min (ref >60)
Glucose, Bld: 98 mg/dL (ref 70–99)
Potassium: 3.9 mmol/L (ref 3.5–5.1)
Sodium: 136 mmol/L (ref 135–145)

## 2020-06-06 LAB — CBC
HCT: 34.9 % — ABNORMAL LOW (ref 39.0–52.0)
Hemoglobin: 12 g/dL — ABNORMAL LOW (ref 13.0–17.0)
MCH: 32 pg (ref 26.0–34.0)
MCHC: 34.4 g/dL (ref 30.0–36.0)
MCV: 93.1 fL (ref 80.0–100.0)
Platelets: 172 10*3/uL (ref 150–400)
RBC: 3.75 MIL/uL — ABNORMAL LOW (ref 4.22–5.81)
RDW: 12 % (ref 11.5–15.5)
WBC: 7.3 10*3/uL (ref 4.0–10.5)
nRBC: 0 % (ref 0.0–0.2)

## 2020-06-06 LAB — HIV ANTIBODY (ROUTINE TESTING W REFLEX): HIV Screen 4th Generation wRfx: NONREACTIVE

## 2020-06-06 NOTE — Plan of Care (Signed)
Patient up to chair x3 this day.  No use of narcotics for breakthrough pain, denies pain. Chest tube remains intact,. Tolerating solid food and liquids. EKG completed for intermittent cardiac pauses of 2.3. Patient asymptomatic at times of pause. Wife at bedside and updated both on plan of care.  Problem: Education: Goal: Knowledge of General Education information will improve Description: Including pain rating scale, medication(s)/side effects and non-pharmacologic comfort measures Outcome: Progressing   Problem: Health Behavior/Discharge Planning: Goal: Ability to manage health-related needs will improve Outcome: Progressing   Problem: Clinical Measurements: Goal: Ability to maintain clinical measurements within normal limits will improve Outcome: Progressing Goal: Will remain free from infection Outcome: Progressing Goal: Diagnostic test results will improve Outcome: Progressing Goal: Respiratory complications will improve Outcome: Progressing Goal: Cardiovascular complication will be avoided Outcome: Progressing   Problem: Activity: Goal: Risk for activity intolerance will decrease Outcome: Progressing   Problem: Nutrition: Goal: Adequate nutrition will be maintained Outcome: Progressing   Problem: Coping: Goal: Level of anxiety will decrease Outcome: Progressing   Problem: Elimination: Goal: Will not experience complications related to bowel motility Outcome: Progressing Goal: Will not experience complications related to urinary retention Outcome: Progressing   Problem: Pain Managment: Goal: General experience of comfort will improve Outcome: Progressing   Problem: Safety: Goal: Ability to remain free from injury will improve Outcome: Progressing   Problem: Skin Integrity: Goal: Risk for impaired skin integrity will decrease Outcome: Progressing

## 2020-06-06 NOTE — Progress Notes (Signed)
OT Cancellation Note  Patient Details Name: Riley Garcia MRN: [id number] DOB: [birth year]   Cancelled Treatment:    Reason Eval/Treat Not Completed: OT screened, no needs identified, will sign off.  PT assessment completed and advised no OT needs.    Panzy Bubeck D Marjorie Deprey 06/06/2020, 3:06 PM
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
## 2020-06-06 NOTE — Progress Notes (Signed)
Call received from [place] of 2.31 pause at 0841 and 2.12 pause at [phone] and transitioning into a 2nd degree type 2 block and then back into sinus rythmn. Patient asymptomatic, skin warm, dry and pink. No c/o pain. [employer] PA notified of above information. No new orders at this time.

## 2020-06-06 NOTE — Progress Notes (Signed)
Pt stable Breathing well No new ortho issues - left shoulder pain improved Dc likely monday

## 2020-06-07 ENCOUNTER — Inpatient Hospital Stay (HOSPITAL_COMMUNITY): Payer: BC Managed Care – PPO

## 2020-06-07 MED ORDER — CEFAZOLIN SODIUM-DEXTROSE 1-4 GM/50ML-% IV SOLN
1.0000 g | Freq: Three times a day (TID) | INTRAVENOUS | Status: DC
  Administered 2020-06-08 (×2): 1 g via INTRAVENOUS
  Filled 2020-06-07 (×4): qty 50

## 2020-06-07 NOTE — Progress Notes (Signed)
Left elbow looks like it may be developing cellulitis around abrasion over the olecranon.  Plan IV antibiotics overnight and I will check it sometime tomorrow probably around midday.  Fluid in the olecranon bursa may necessitate aspiration.  Otherwise the cellulitis should resolve with IV then p.o. antibiotic treatment.
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
## 2020-06-08 ENCOUNTER — Inpatient Hospital Stay (HOSPITAL_COMMUNITY): Payer: BC Managed Care – PPO

## 2020-06-08 ENCOUNTER — Other Ambulatory Visit: Payer: Self-pay

## 2020-06-08 MED ORDER — LIDOCAINE 5 % EX PTCH: 1.0000 | MEDICATED_PATCH | CUTANEOUS | 0 refills | Status: DC

## 2020-06-08 MED ORDER — METHOCARBAMOL 750 MG PO TABS: 750.0000 mg | ORAL_TABLET | Freq: Four times a day (QID) | ORAL | 0 refills | Status: DC

## 2020-06-08 MED ORDER — CEPHALEXIN 500 MG PO CAPS: ORAL_CAPSULE | ORAL | 0 refills | Status: DC

## 2020-06-08 MED ORDER — KETOROLAC TROMETHAMINE 10 MG PO TABS: 10.0000 mg | ORAL_TABLET | Freq: Four times a day (QID) | ORAL | 0 refills | Status: AC | PRN

## 2020-06-08 MED ORDER — ACETAMINOPHEN 500 MG PO TABS: 1000.0000 mg | ORAL_TABLET | Freq: Four times a day (QID) | ORAL | Status: DC | PRN

## 2020-06-08 NOTE — Progress Notes (Signed)
Hi [name] I reviewed Riley Garcia's pictures and it looks like he still has a little redness and possible cellulitis around that elbow abrasion.  Called in 5 days of antibiotics for him.  He is sending me pictures every couple of days of the elbow for assessment.
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
## 2020-06-08 NOTE — Progress Notes (Signed)
Submitted Keflex 500mg  po bid x 5 days per request to patients pharmacy.

## 2020-06-08 NOTE — Discharge Instructions (Signed)
Rib Fracture  A rib fracture is a break or crack in one of the bones of the ribs. The ribs are like a cage that goes around your upper chest. A broken or cracked rib is often painful, but most do not cause other problems. Most rib fractures usually heal on their own in 1-3 months. Follow these instructions at home: Managing pain, stiffness, and swelling  If directed, apply ice to the injured area. ? Put ice in a plastic bag. ? Place a towel between your skin and the bag. ? Leave the ice on for 20 minutes, 2-3 times a day.  Take over-the-counter and prescription medicines only as told by your doctor. Activity  Avoid activities that cause pain to the injured area. Protect your injured area.  Slowly increase activity as told by your doctor. General instructions  Do deep breathing as told by your doctor. You may be told to: ? Take deep breaths many times a day. ? Cough many times a day while hugging a pillow. ? Use a device (incentive spirometer) to do deep breathing many times a day.  Drink enough fluid to keep your pee (urine) clear or pale yellow.  Do not wear a rib belt or binder. These do not allow you to breathe deeply.  Keep all follow-up visits as told by your doctor. This is important. Contact a doctor if:  You have a fever. Get help right away if:  You have trouble breathing.  You are short of breath.  You cannot stop coughing.  You cough up thick or bloody spit (sputum).  You feel sick to your stomach (nauseous), throw up (vomit), or have belly (abdominal) pain.  Your pain gets worse and medicine does not help. Summary  A rib fracture is a break or crack in one of the bones of the ribs.  Apply ice to the injured area and take medicines for pain as told by your doctor.  Take deep breaths and cough many times a day. Hug a pillow every time you cough. This information is not intended to replace advice given to you by your health care provider. Make sure you  discuss any questions you have with your health care provider. Document Revised: 05/19/2017 Document Reviewed: 09/06/2016 Elsevier Patient Education  [employer].   Pneumothorax A pneumothorax is commonly called a collapsed lung. It is a condition in which air leaks from a lung and builds up between the thin layer of tissue that covers the lungs (visceral pleura) and the interior wall of the chest cavity (parietal pleura). The air gets trapped outside the lung, between the lung and the chest wall (pleural space). The air takes up space and prevents the lung from fully expanding. This condition sometimes occurs suddenly with no apparent cause. The buildup of air may be small or large. A small pneumothorax may go away on its own. A large pneumothorax will require treatment and hospitalization. What are the causes? This condition may be caused by:  Trauma and injury to the chest wall.  Surgery and other medical procedures.  A complication of an underlying lung problem, especially chronic obstructive pulmonary disease (COPD) or emphysema. Sometimes the cause of this condition is not known. What increases the risk? You are more likely to develop this condition if:  You have an underlying lung problem.  You smoke.  You are 46-19 years old, male, tall, and underweight.  You have a personal or family history of pneumothorax.  You have an eating disorder (  anorexia nervosa). This condition can also happen quickly, even in people with no history of lung problems. What are the signs or symptoms? Sometimes a pneumothorax will have no symptoms. When symptoms are present, they can include:  Chest pain.  Shortness of breath.  Increased rate of breathing.  Bluish color to your lips or skin (cyanosis). How is this diagnosed? This condition may be diagnosed by:  A medical history and physical exam.  A chest X-ray, chest CT scan, or ultrasound. How is this treated? Treatment depends  on how severe your condition is. The goal of treatment is to remove the extra air and allow your lung to expand back to its normal size.  For a small pneumothorax: ? No treatment may be needed. ? Extra oxygen is sometimes used to make it go away more quickly.  For a large pneumothorax or a pneumothorax that is causing symptoms, a procedure is done to drain the air from your lungs. To do this, a health care provider may use: ? A needle with a syringe. This is used to suck air from a pleural space where no additional leakage is taking place. ? A chest tube. This is used to suck air where there is ongoing leakage into the pleural space. The chest tube may need to remain in place for several days until the air leak has healed.  In more severe cases, surgery may be needed to repair the damage that is causing the leak.  If you have multiple pneumothorax episodes or have an air leak that will not heal, a procedure called a pleurodesis may be done. A medicine is placed in the pleural space to irritate the tissues around the lung so that the lung will stick to the chest wall, seal any leaks, and stop any buildup of air in that space. If you have an underlying lung problem, severe symptoms, or a large pneumothorax you will usually need to stay in the hospital. Follow these instructions at home: Lifestyle  Do not use any products that contain nicotine or tobacco, such as cigarettes and e-cigarettes. These are major risk factors in pneumothorax. If you need help quitting, ask your health care provider.  Do not lift anything that is heavier than 10 lb (4.5 kg), or the limit that your health care provider tells you, until he or she says that it is safe.  Avoid activities that take a lot of effort (strenuous) for as long as told by your health care provider.  Return to your normal activities as told by your health care provider. Ask your health care provider what activities are safe for you.  Do not fly in  an airplane or scuba dive until your health care provider says it is okay. General instructions  Take over-the-counter and prescription medicines only as told by your health care provider.  If a cough or pain makes it difficult for you to sleep at night, try sleeping in a semi-upright position in a recliner or by using 2 or 3 pillows.  If you had a chest tube and it was removed, ask your health care provider when you can remove the bandage (dressing). While the dressing is in place, do not allow it to get wet.  Keep all follow-up visits as told by your health care provider. This is important. Contact a health care provider if:  You cough up thick mucus (sputum) that is yellow or green in color.  You were treated with a chest tube, and you have redness,  increasing pain, or discharge at the site where it was placed. Get help right away if:  You have increasing chest pain or shortness of breath.  You have a cough that will not go away.  You begin coughing up blood.  You have pain that is getting worse or is not controlled with medicines.  The site where your chest tube was located opens up.  You feel air coming out of the site where the chest tube was placed.  You have a fever or persistent symptoms for more than 2-3 days.  You have a fever and your symptoms suddenly get worse. These symptoms may represent a serious problem that is an emergency. Do not wait to see if the symptoms will go away. Get medical help right away. Call your local emergency services (911 in the [place] Do not drive yourself to the hospital. Summary  A pneumothorax, commonly called a collapsed lung, is a condition in which air leaks from a lung and gets trapped between the lung and the chest wall (pleural space).  The buildup of air may be small or large. A small pneumothorax may go away on its own. A large pneumothorax will require treatment and hospitalization.  Treatment for this condition depends on how  severe the pneumothorax is. The goal of treatment is to remove the extra air and allow the lung to expand back to its normal size. This information is not intended to replace advice given to you by your health care provider. Make sure you discuss any questions you have with your health care provider. Document Revised: 05/19/2017 Document Reviewed: 05/15/2017 Elsevier Patient Education  2020 [employer].

## 2020-06-10 ENCOUNTER — Encounter (HOSPITAL_COMMUNITY): Payer: Self-pay

## 2020-06-20 HISTORY — PX: WRIST SURGERY: SHX841
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
## 2020-06-23 ENCOUNTER — Ambulatory Visit (INDEPENDENT_AMBULATORY_CARE_PROVIDER_SITE_OTHER): Payer: BC Managed Care – PPO | Admitting: Family Medicine

## 2020-06-23 ENCOUNTER — Encounter: Payer: Self-pay | Admitting: Orthopedic Surgery

## 2020-06-23 ENCOUNTER — Other Ambulatory Visit: Payer: Self-pay

## 2020-06-23 ENCOUNTER — Encounter: Payer: Self-pay | Admitting: Family Medicine

## 2020-06-23 ENCOUNTER — Ambulatory Visit (INDEPENDENT_AMBULATORY_CARE_PROVIDER_SITE_OTHER): Payer: BC Managed Care – PPO | Admitting: Orthopedic Surgery

## 2020-06-23 VITALS — BP 136/82 | Temp 98.8°F | Ht 74.0 in | Wt 209.2 lb

## 2020-06-23 DIAGNOSIS — S060X0A Concussion without loss of consciousness, initial encounter: Secondary | ICD-10-CM

## 2020-06-23 DIAGNOSIS — J939 Pneumothorax, unspecified: Secondary | ICD-10-CM

## 2020-06-23 DIAGNOSIS — S2232XA Fracture of one rib, left side, initial encounter for closed fracture: Secondary | ICD-10-CM

## 2020-06-23 DIAGNOSIS — M25422 Effusion, left elbow: Secondary | ICD-10-CM

## 2020-06-23 DIAGNOSIS — M7022 Olecranon bursitis, left elbow: Secondary | ICD-10-CM | POA: Diagnosis not present

## 2020-06-23 DIAGNOSIS — R001 Bradycardia, unspecified: Secondary | ICD-10-CM

## 2020-06-23 DIAGNOSIS — Z1159 Encounter for screening for other viral diseases: Secondary | ICD-10-CM

## 2020-06-23 MED ORDER — DOXYCYCLINE HYCLATE 50 MG PO CAPS: 100.0000 mg | ORAL_CAPSULE | Freq: Two times a day (BID) | ORAL | 0 refills | Status: DC

## 2020-06-23 MED ORDER — LIDOCAINE HCL 1 % IJ SOLN
5.0000 mL | INTRAMUSCULAR | Status: AC | PRN
  Administered 2020-06-23: 5 mL
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
## 2020-06-26 ENCOUNTER — Encounter: Payer: Self-pay | Admitting: Orthopedic Surgery

## 2020-06-26 ENCOUNTER — Encounter: Payer: Self-pay | Admitting: Family Medicine

## 2020-06-26 ENCOUNTER — Ambulatory Visit (INDEPENDENT_AMBULATORY_CARE_PROVIDER_SITE_OTHER): Payer: BC Managed Care – PPO | Admitting: Orthopedic Surgery

## 2020-06-26 DIAGNOSIS — M25512 Pain in left shoulder: Secondary | ICD-10-CM | POA: Diagnosis not present

## 2020-06-26 DIAGNOSIS — M25422 Effusion, left elbow: Secondary | ICD-10-CM

## 2020-06-26 DIAGNOSIS — M25532 Pain in left wrist: Secondary | ICD-10-CM | POA: Diagnosis not present
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
## 2020-06-29 LAB — SYNOVIAL FLUID ANALYSIS, COMPLETE
Basophils, %: 0 %
Eosinophils-Synovial: 0 % (ref 0–2)
Lymphocytes-Synovial Fld: 70 % (ref 0–74)
Monocyte/Macrophage: 26 % (ref 0–69)
Neutrophil, Synovial: 4 % (ref 0–24)
Synoviocytes, %: 0 % (ref 0–15)
WBC, Synovial: 706 cells/uL — ABNORMAL HIGH (ref <150)

## 2020-06-29 LAB — ANAEROBIC AND AEROBIC CULTURE
AER RESULT:: NO GROWTH
GRAM STAIN:: NONE SEEN
MICRO NUMBER:: 11380073
MICRO NUMBER:: 11380074
SPECIMEN QUALITY:: ADEQUATE
SPECIMEN QUALITY:: ADEQUATE

## 2020-06-30 NOTE — Progress Notes (Signed)
Letter has been signed and placed in the mail for the patient.

## 2020-07-14 ENCOUNTER — Other Ambulatory Visit: Payer: Self-pay

## 2020-07-14 DIAGNOSIS — M25532 Pain in left wrist: Secondary | ICD-10-CM
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
## 2020-07-14 NOTE — Progress Notes (Signed)
Patient called said he had talked with you about left wrist pain. I have put in order for MR arthrogram

## 2020-07-28 ENCOUNTER — Ambulatory Visit: Payer: BC Managed Care – PPO | Admitting: Surgical

## 2020-08-06 ENCOUNTER — Other Ambulatory Visit: Payer: Self-pay

## 2020-08-06 ENCOUNTER — Ambulatory Visit
Admission: RE | Admit: 2020-08-06 | Discharge: 2020-08-06 | Disposition: A | Discharge status: Home / Self Care | Payer: BC Managed Care – PPO | Source: Ambulatory Visit | Attending: Orthopedic Surgery | Admitting: Orthopedic Surgery

## 2020-08-06 DIAGNOSIS — M25532 Pain in left wrist: Secondary | ICD-10-CM

## 2020-08-06 MED ORDER — IOPAMIDOL (ISOVUE-M 200) INJECTION 41%
3.0000 mL | Freq: Once | INTRAMUSCULAR | Status: AC
  Administered 2020-08-06: 3 mL via INTRA_ARTICULAR

## 2020-08-11 ENCOUNTER — Other Ambulatory Visit: Payer: Self-pay

## 2020-08-11 DIAGNOSIS — M25532 Pain in left wrist: Secondary | ICD-10-CM

## 2020-08-12 ENCOUNTER — Ambulatory Visit: Payer: BC Managed Care – PPO | Admitting: Orthopedic Surgery

## 2020-08-31 NOTE — Patient Instructions (Addendum)
Please stop by lab before you go If you have mychart- we will send your results within 3 business days of [place] receiving them.  If you do not have mychart- we will call you about results within 5 business days of [place] receiving them.  *please also note that you will see labs on mychart as soon as they post. I will later go in and write notes on them- will say "notes from Dr. Yong Channel"  We will call you within two weeks about your referral for coronary calcium scoring. If you do not hear within 2 weeks, give [place] a call.   Update dental exam with new insurance  Recommended follow up: Return in about 1 year (around 09/03/2021) for physical or sooner if needed.
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
## 2020-09-03 ENCOUNTER — Other Ambulatory Visit: Payer: Self-pay

## 2020-09-03 ENCOUNTER — Encounter: Payer: Self-pay | Admitting: Family Medicine

## 2020-09-03 ENCOUNTER — Ambulatory Visit (INDEPENDENT_AMBULATORY_CARE_PROVIDER_SITE_OTHER): Payer: BC Managed Care – PPO | Admitting: Family Medicine

## 2020-09-03 VITALS — BP 128/64 | HR 50 | Temp 97.7°F | Ht 74.0 in | Wt 210.2 lb

## 2020-09-03 DIAGNOSIS — Z13220 Encounter for screening for lipoid disorders: Secondary | ICD-10-CM | POA: Diagnosis not present

## 2020-09-03 DIAGNOSIS — Z8249 Family history of ischemic heart disease and other diseases of the circulatory system: Secondary | ICD-10-CM

## 2020-09-03 DIAGNOSIS — Z0000 Encounter for general adult medical examination without abnormal findings: Secondary | ICD-10-CM | POA: Diagnosis not present

## 2020-09-03 DIAGNOSIS — Z1159 Encounter for screening for other viral diseases: Secondary | ICD-10-CM

## 2020-09-03 DIAGNOSIS — E785 Hyperlipidemia, unspecified: Secondary | ICD-10-CM | POA: Diagnosis not present

## 2020-09-03 LAB — CBC WITH DIFFERENTIAL/PLATELET
Basophils Absolute: 0 10*3/uL (ref 0.0–0.1)
Basophils Relative: 0.5 % (ref 0.0–3.0)
Eosinophils Absolute: 0.1 10*3/uL (ref 0.0–0.7)
Eosinophils Relative: 2.3 % (ref 0.0–5.0)
HCT: 38.5 % — ABNORMAL LOW (ref 39.0–52.0)
Hemoglobin: 13.2 g/dL (ref 13.0–17.0)
Lymphocytes Relative: 30.1 % (ref 12.0–46.0)
Lymphs Abs: 1.1 10*3/uL (ref 0.7–4.0)
MCHC: 34.4 g/dL (ref 30.0–36.0)
MCV: 92.6 fl (ref 78.0–100.0)
Monocytes Absolute: 0.3 10*3/uL (ref 0.1–1.0)
Monocytes Relative: 7.9 % (ref 3.0–12.0)
Neutro Abs: 2.2 10*3/uL (ref 1.4–7.7)
Neutrophils Relative %: 59.2 % (ref 43.0–77.0)
Platelets: 182 10*3/uL (ref 150.0–400.0)
RBC: 4.16 Mil/uL — ABNORMAL LOW (ref 4.22–5.81)
RDW: 12.5 % (ref 11.5–15.5)
WBC: 3.7 10*3/uL — ABNORMAL LOW (ref 4.0–10.5)

## 2020-09-03 LAB — LIPID PANEL
Cholesterol: 180 mg/dL (ref 0–200)
HDL: 79.6 mg/dL (ref >39.00)
LDL Cholesterol: 83 mg/dL (ref 0–99)
NonHDL: 100.78
Total CHOL/HDL Ratio: 2
Triglycerides: 89 mg/dL (ref 0.0–149.0)
VLDL: 17.8 mg/dL (ref 0.0–40.0)

## 2020-09-03 LAB — COMPREHENSIVE METABOLIC PANEL
ALT: 10 U/L (ref 0–53)
AST: 13 U/L (ref 0–37)
Albumin: 4.3 g/dL (ref 3.5–5.2)
Alkaline Phosphatase: 56 U/L (ref 39–117)
BUN: 23 mg/dL (ref 6–23)
CO2: 33 mEq/L — ABNORMAL HIGH (ref 19–32)
Calcium: 9.6 mg/dL (ref 8.4–10.5)
Chloride: 101 mEq/L (ref 96–112)
Creatinine, Ser: 0.73 mg/dL (ref 0.40–1.50)
GFR: 111.26 mL/min (ref >60.00)
Glucose, Bld: 87 mg/dL (ref 70–99)
Potassium: 4.3 mEq/L (ref 3.5–5.1)
Sodium: 140 mEq/L (ref 135–145)
Total Bilirubin: 1 mg/dL (ref 0.2–1.2)
Total Protein: 6.7 g/dL (ref 6.0–8.3)

## 2020-10-02 ENCOUNTER — Ambulatory Visit (INDEPENDENT_AMBULATORY_CARE_PROVIDER_SITE_OTHER)
Admission: RE | Admit: 2020-10-02 | Discharge: 2020-10-02 | Disposition: A | Discharge status: Home / Self Care | Payer: Self-pay | Source: Ambulatory Visit | Attending: Family Medicine | Admitting: Family Medicine

## 2020-10-02 ENCOUNTER — Other Ambulatory Visit: Payer: Self-pay

## 2020-10-02 DIAGNOSIS — Z8249 Family history of ischemic heart disease and other diseases of the circulatory system: Secondary | ICD-10-CM

## 2020-10-05 ENCOUNTER — Telehealth: Payer: Self-pay

## 2020-10-05 ENCOUNTER — Encounter: Payer: Self-pay | Admitting: Family Medicine

## 2020-10-05 ENCOUNTER — Other Ambulatory Visit: Payer: Self-pay

## 2020-10-05 DIAGNOSIS — R931 Abnormal findings on diagnostic imaging of heart and coronary circulation: Secondary | ICD-10-CM

## 2020-10-05 NOTE — Telephone Encounter (Signed)
Pt returned Jaz's call. Please advise.

## 2020-10-05 NOTE — Telephone Encounter (Signed)
Spoke with patient. Aware of his lab results.

## 2020-10-19 ENCOUNTER — Encounter: Payer: BC Managed Care – PPO | Admitting: Family Medicine

## 2020-11-17 ENCOUNTER — Encounter: Payer: Self-pay | Admitting: Cardiovascular Disease

## 2020-11-17 ENCOUNTER — Ambulatory Visit (INDEPENDENT_AMBULATORY_CARE_PROVIDER_SITE_OTHER): Payer: 59 | Admitting: Cardiovascular Disease

## 2020-11-17 ENCOUNTER — Other Ambulatory Visit: Payer: Self-pay

## 2020-11-17 VITALS — BP 126/80 | HR 44 | Ht 74.0 in | Wt 209.4 lb

## 2020-11-17 DIAGNOSIS — R931 Abnormal findings on diagnostic imaging of heart and coronary circulation: Secondary | ICD-10-CM

## 2020-11-17 DIAGNOSIS — R9431 Abnormal electrocardiogram [ECG] [EKG]: Secondary | ICD-10-CM | POA: Diagnosis not present

## 2020-11-17 NOTE — Patient Instructions (Signed)
Medication Instructions:  Your physician recommends that you continue on your current medications as directed. Please refer to the Current Medication list given to you today.  *If you need a refill on your cardiac medications before your next appointment, please call your pharmacy*   Testing/Procedures: Your physician has requested that you have an echocardiogram. Echocardiography is a painless test that uses sound waves to create images of your heart. It provides your doctor with information about the size and shape of your heart and how well your heart's chambers and valves are working. This procedure takes approximately one hour. There are no restrictions for this procedure. This procedure is done at [address]rd Floor   Follow-Up: At [facility], you and your health needs are our priority.  As part of our continuing mission to provide you with exceptional heart care, we have created designated Provider Care Teams.  These Care Teams include your primary Cardiologist (physician) and Advanced Practice Providers (APPs -  Physician Assistants and Nurse Practitioners) who all work together to provide you with the care you need, when you need it.  We recommend signing up for the patient portal called "MyChart".  Sign up information is provided on this After Visit Summary.  MyChart is used to connect with patients for Virtual Visits (Telemedicine).  Patients are able to view lab/test results, encounter notes, upcoming appointments, etc.  Non-urgent messages can be sent to your provider as well.   To learn more about what you can do with MyChart, go to [email or web address].    Your next appointment:   2 year(s)  The format for your next appointment:   In Person  Provider:   Quay Burow, MD

## 2020-11-17 NOTE — Assessment & Plan Note (Signed)
Elevated coronary calcium score of 72 performed 10/02/2020.  Majority the calcium was in the right coronary artery.  He does have a strong family history of heart disease but is completely asymptomatic.  His most recent lipid profile performed 09/03/2020 revealed total cholesterol 180, LDL of 83 and HDL of 79.  I think at this point I am not going to start him on a statin drug.  He has decided to change his diet as well.

## 2020-11-17 NOTE — Progress Notes (Signed)
11/17/2020 Sabastien Garcia   01-03-1976  [id number]  Primary Physician Yong Channel Brayton Mars, MD Primary Cardiologist: Lorretta Harp MD Lupe Carney, [place]  HPI:  Riley Garcia is a 45 y.o. 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  His wife apparently was diagnosed with multiple sclerosis in 2010 but is clinically stable.  He is an active cyclist.  He was referred by Dr. Yong Channel because of recent coronary calcium score performed 10/02/2020 which was 72 total with the majority being in the right coronary artery.  His only risk factor is family history although most of this is on his father side of the family and not first-degree relatives.  He bicycles 10 to 12,000 miles a year and is very active.  Is completely asymptomatic.  He does have a mildly elevated total cholesterol at 180 although his HDL is 79 and LDL is 83.   No outpatient medications have been marked as taking for the 11/17/20 encounter (Office Visit) with Lorretta Harp, MD.     Allergies  Allergen Reactions  . Cephalexin     Patient reported hives    Social History   Socioeconomic History  . Marital status: Married    Spouse name: Not on file  . Number of children: Not on file  . Years of education: Not on file  . Highest education level: Not on file  Occupational History  . Not on file  Tobacco Use  . Smoking status: Never Smoker  . Smokeless tobacco: Never Used  Vaping Use  . Vaping Use: Never used  Substance and Sexual Activity  . Alcohol use: Yes    Comment: Social  . Drug use: Never  . Sexual activity: Yes  Other Topics Concern  . Not on file  Social History Narrative   Married. 2 children 47 and 81 year old daughters 3/20222. Oldest at [employer] (will grad year early) and youngest at [employer].    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 Helping participants with fundraising and  local ride in triad.    Prior- Works in [employer]- one of top 7 folks under the [place].    Undergrad at [employer] at [employer]: cycling, rare golf. Enjoys the outdoors. Working in the yard.    Social Determinants of Health   Financial Resource Strain: Not on file  Food Insecurity: Not on file  Transportation Needs: Not on file  Physical Activity: Not on file  Stress: Not on file  Social Connections: Not on file  Intimate Partner Violence: Not on file     Review of Systems: General: negative for chills, fever, night sweats or weight changes.  Cardiovascular: negative for chest pain, dyspnea on exertion, edema, orthopnea, palpitations, paroxysmal nocturnal dyspnea or shortness of breath Dermatological: negative for rash Respiratory: negative for cough or wheezing Urologic: negative for hematuria Abdominal: negative for nausea, vomiting, diarrhea, bright red blood per rectum, melena, or hematemesis Neurologic: negative for visual changes, syncope, or dizziness All other systems reviewed and are otherwise negative except as noted above.    Blood pressure 126/80, pulse (!) 44, height 6\' 2"  (1.88 m), weight 209 lb 6.4 oz (95 kg), SpO2 96 %.  General appearance: alert and no distress Neck: no adenopathy, no carotid bruit, no JVD,  supple, symmetrical, trachea midline and thyroid not enlarged, symmetric, no tenderness/mass/nodules Lungs: clear to auscultation bilaterally Heart: regular rate and rhythm, S1, S2 normal, no murmur, click, rub or gallop Extremities: extremities normal, atraumatic, no cyanosis or edema Pulses: 2+ and symmetric Skin: Skin color, texture, turgor normal. No rashes or lesions Neurologic: Alert and oriented X 3, normal strength and tone. Normal symmetric reflexes. Normal coordination and gait  EKG sinus bradycardia at 44 with LVH voltage.  I personally reviewed this EKG.  ASSESSMENT AND  PLAN:   Abnormal EKG EKG shows sinus bradycardia with LVH voltage.  I am going check a 2D echo to further evaluate.  Elevated coronary artery calcium score Elevated coronary calcium score of 72 performed 10/02/2020.  Majority the calcium was in the right coronary artery.  He does have a strong family history of heart disease but is completely asymptomatic.  His most recent lipid profile performed 09/03/2020 revealed total cholesterol 180, LDL of 83 and HDL of 79.  I think at this point I am not going to start him on a statin drug.  He has decided to change his diet as well.      Lorretta Harp MD FACP,FACC,FAHA, [facility] 11/17/2020 9:11 AM

## 2020-11-17 NOTE — Assessment & Plan Note (Signed)
EKG shows sinus bradycardia with LVH voltage.  I am going check a 2D echo to further evaluate.

## 2020-12-17 ENCOUNTER — Other Ambulatory Visit: Payer: Self-pay

## 2020-12-17 ENCOUNTER — Ambulatory Visit (HOSPITAL_COMMUNITY): Payer: 59 | Attending: Cardiovascular Disease

## 2020-12-17 DIAGNOSIS — R9431 Abnormal electrocardiogram [ECG] [EKG]: Secondary | ICD-10-CM | POA: Insufficient documentation

## 2020-12-17 DIAGNOSIS — R931 Abnormal findings on diagnostic imaging of heart and coronary circulation: Secondary | ICD-10-CM

## 2020-12-17 LAB — ECHOCARDIOGRAM COMPLETE
Area-P 1/2: 3.6 cm2
S' Lateral: 3.2 cm

## 2020-12-18 ENCOUNTER — Other Ambulatory Visit: Payer: Self-pay | Admitting: *Deleted

## 2020-12-18 DIAGNOSIS — I712 Thoracic aortic aneurysm, without rupture, unspecified: Secondary | ICD-10-CM

## 2020-12-18 NOTE — Progress Notes (Signed)
ECHO

## 2021-03-16 ENCOUNTER — Telehealth: Payer: 59 | Admitting: Physician Assistant

## 2021-03-16 ENCOUNTER — Other Ambulatory Visit: Payer: Self-pay

## 2021-03-16 ENCOUNTER — Ambulatory Visit
Admission: EM | Admit: 2021-03-16 | Discharge: 2021-03-16 | Disposition: A | Discharge status: Home / Self Care | Payer: 59 | Attending: Emergency Medicine | Admitting: Emergency Medicine

## 2021-03-16 DIAGNOSIS — J069 Acute upper respiratory infection, unspecified: Secondary | ICD-10-CM | POA: Diagnosis present

## 2021-03-16 LAB — POCT RAPID STREP A (OFFICE): Rapid Strep A Screen: NEGATIVE

## 2021-03-16 MED ORDER — BENZONATATE 200 MG PO CAPS: 200.0000 mg | ORAL_CAPSULE | Freq: Three times a day (TID) | ORAL | 0 refills | Status: AC | PRN

## 2021-03-16 MED ORDER — FLUTICASONE PROPIONATE 50 MCG/ACT NA SUSP: 1.0000 | Freq: Every day | NASAL | 0 refills | Status: DC

## 2021-03-16 NOTE — Discharge Instructions (Addendum)
Strep test negative, COVID test pending, monitor MyChart for results Rest and fluids Continue Tylenol and ibuprofen May begin daily antihistamine-daily cetirizine/Zyrtec or loratadine/Claritin Flonase nasal spray 1 to 2 spray in each nostril daily May use over-the-counter Mucinex or Sudafed to further relieve congestion May use Tessalon for cough or over-the-counter Robitussin, Delsym or Dimetapp Please follow-up if not improving or worsening

## 2021-03-16 NOTE — Progress Notes (Signed)
Virtual Visit Consent   Riley Garcia, you are scheduled for a virtual visit with a [facility] provider today.     Just as with appointments in the office, your consent must be obtained to participate.  Your consent will be active for this visit and any virtual visit you may have with one of our providers in the next 365 days.     If you have a MyChart account, a copy of this consent can be sent to you electronically.  All virtual visits are billed to your insurance company just like a traditional visit in the office.    As this is a virtual visit, video technology does not allow for your provider to perform a traditional examination.  This may limit your provider's ability to fully assess your condition.  If your provider identifies any concerns that need to be evaluated in person or the need to arrange testing (such as labs, EKG, etc.), we will make arrangements to do so.     Although advances in technology are sophisticated, we cannot ensure that it will always work on either your end or our end.  If the connection with a video visit is poor, the visit may have to be switched to a telephone visit.  With either a video or telephone visit, we are not always able to ensure that we have a secure connection.     I need to obtain your verbal consent now.   Are you willing to proceed with your visit today?    Riley Garcia has provided verbal consent on 03/16/2021 for a virtual visit (video or telephone).   Mar Daring, PA-C   Date: 03/16/2021 8:46 AM   Virtual Visit via Video Note   I, Mar Daring, connected with  Riley Garcia  ([id number], Nov 27, 1975) on 03/16/21 at  8:30 AM EDT by a video-enabled telemedicine application and verified that I am speaking with the correct person using two identifiers.  Location: Patient: Virtual Visit Location Patient: Home Provider: Virtual Visit Location Provider: Home Office   I discussed the limitations of evaluation and management by telemedicine  and the availability of in person appointments. The patient expressed understanding and agreed to proceed.    History of Present Illness: Riley Garcia is a 45 y.o. who identifies as a male who was assigned male at birth, and is being seen today for sore throat.  HPI: URI  This is a new problem. The current episode started in the past 7 days (Sunday). The problem has been gradually worsening. There has been no fever. Associated symptoms include congestion, coughing, headaches, sinus pain (started this morning) and a sore throat. Pertinent negatives include no diarrhea, ear pain, nausea, plugged ear sensation, rhinorrhea or vomiting. Associated symptoms comments: Fatigue, body aches, post nasal drainage.     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  Observations/Objective: Patient is well-developed, well-nourished in no acute distress.  Resting comfortably at home.  Head is normocephalic, atraumatic.  No labored breathing.  Speech is clear and coherent with logical content.  Patient is alert and oriented at baseline.    Assessment and Plan: 1.  Viral URI - Patient desires testing for Flu, Covid, and strep; He has a trip to [place] planned and needs to know if it has to be canceled - Deferred to in person UC for testing  Follow Up Instructions: I discussed the assessment and treatment plan with the patient. The patient was provided an opportunity to ask questions and all were answered. The patient agreed with the plan and demonstrated an understanding of the instructions.  A copy of instructions were sent to the patient via MyChart unless  otherwise noted below.   The patient was advised to call back or seek an in-person evaluation if the symptoms worsen or if the condition fails to improve as anticipated.  Time:  I spent 15 minutes with the patient via telehealth technology discussing the above problems/concerns.    Mar Daring, PA-C

## 2021-03-16 NOTE — ED Triage Notes (Signed)
Pt reports have fatigue, sore throat, sinus pressure and cough that started Sunday night.   Home interventions: tylenol and ibuprofen taken today, patient states they were somewhat helpful

## 2021-03-16 NOTE — Patient Instructions (Signed)
Based on what you shared with me, I feel your condition warrants further evaluation and I recommend that you be seen in a face to face visit.   NOTE: There will be NO CHARGE for this eVisit   If you are having a true medical emergency please call 911.      For an urgent face to face visit, [facility] has six urgent care centers for your convenience:     [facility] [place] at  Get Driving Directions [phone] [place], [place] [address]    [facility]) Get Driving Directions [phone] [place], [place] [address]  [facility] [facility]) Get Driving Directions [phone] [address] [place],  [place]  [address]  [facility] Get Driving Directions [phone] [place], [place], [place] [address]   [facility] Get Driving Directions  [phone] [address] [place], [place] [address]   [facility] at [place] Get Driving Directions [phone] [address] [place], [place] [address]  Your MyChart E-visit questionnaire answers were reviewed by a board certified advanced clinical practitioner to complete your personal care plan based on your specific symptoms.  Thank you for using e-Visits.

## 2021-03-16 NOTE — ED Provider Notes (Signed)
[facility]    CSN: [id number] Arrival date & time: 03/16/21  0901      History   Chief Complaint Chief Complaint  Patient presents with   Sore Throat   Cough   Fatigue    HPI Riley Garcia is a 45 y.o. male history of Guillain-Barr, presenting today for evaluation of URI symptoms.  Reports associated sore throat and congestion.  Symptoms began approximately 3 days ago and has developed more cough and congestion over the past 2 days.  Denies any known fevers.  Has had associated fatigue.  No known COVID exposures, but did attend a large event this past weekend.  HPI  Past Medical History:  Diagnosis Date   Chicken pox    Guillain Barr syndrome [facility])    sophomore year of college. never went past shins and into hands.     Patient Active Problem List   Diagnosis Date Noted   Elevated coronary artery calcium score 10/05/2020   Abnormal EKG    Pneumothorax on left 06/05/2020   History of Guillain-Barre syndrome 12/20/2018   Tinea versicolor 11/10/2016    Past Surgical History:  Procedure Laterality Date   [place]     SHOULDER ARTHROSCOPY Right    december 2015   VASECTOMY         Home Medications    Prior to Admission medications   Medication Sig Start Date End Date Taking? Authorizing Provider  benzonatate (TESSALON) 200 MG capsule Take 1 capsule (200 mg total) by mouth 3 (three) times daily as needed for up to 7 days for cough. 03/16/21 03/23/21 Yes Glennis Borger C, PA-C  fluticasone (FLONASE) 50 MCG/ACT nasal spray Place 1-2 sprays into both nostrils daily. 03/16/21  Yes Preslynn Bier, Elesa Hacker, PA-C    Family History Family History  Problem Relation Age of Onset   CAD Paternal 51    CAD Paternal Grandfather    CAD Paternal Uncle    Healthy Mother    Hypertension Father    Diabetes Father        Type II   Heart disease Father        arrhythmia- 3 ablations. not sure which type.    Healthy Sister    Multiple sclerosis Brother     Healthy Daughter    Alcohol abuse Paternal Aunt    Alcohol abuse Paternal Uncle    Heart attack Paternal Uncle        after age 94    Alcohol abuse Paternal Grandmother        passed age 69   Heart disease Paternal Grandmother        thinks had heart attack   Hyperlipidemia Paternal Grandfather    Early death Paternal Grandfather        60 from heart attack   Hypertension Paternal Grandfather    Healthy Daughter     Social History Social History   Tobacco Use   Smoking status: Never   Smokeless tobacco: Never  Vaping Use   Vaping Use: Never used  Substance Use Topics   Alcohol use: Yes    Comment: Social   Drug use: Never     Allergies   Cephalexin   Review of Systems Review of Systems  Constitutional:  Negative for activity change, appetite change, chills, fatigue and fever.  HENT:  Positive for congestion and sore throat. Negative for ear pain, rhinorrhea, sinus pressure and trouble swallowing.   Eyes:  Negative for discharge and redness.  Respiratory:  Positive  for cough. Negative for chest tightness and shortness of breath.   Cardiovascular:  Negative for chest pain.  Gastrointestinal:  Negative for abdominal pain, diarrhea, nausea and vomiting.  Musculoskeletal:  Negative for myalgias.  Skin:  Negative for rash.  Neurological:  Negative for dizziness, light-headedness and headaches.    Physical Exam Triage Vital Signs ED Triage Vitals  Enc Vitals Group     BP      Pulse      Resp      Temp      Temp src      SpO2      Weight      Height      Head Circumference      Peak Flow      Pain Score      Pain Loc      Pain Edu?      Excl. in [place]?    No data found.  Updated Vital Signs BP 123/73 (BP Location: Right Arm)   Pulse 60   Temp 98.7 F (37.1 C) (Oral)   Resp 20   SpO2 96%   Visual Acuity Right Eye Distance:   Left Eye Distance:   Bilateral Distance:    Right Eye Near:   Left Eye Near:    Bilateral Near:     Physical  Exam Vitals and nursing note reviewed.  Constitutional:      Appearance: He is well-developed.     Comments: No acute distress  HENT:     Head: Normocephalic and atraumatic.     Ears:     Comments: Bilateral ears without tenderness to palpation of external auricle, tragus and mastoid, EAC's without erythema or swelling, TM's with good bony landmarks and cone of light. Non erythematous.      Nose: Nose normal.     Mouth/Throat:     Comments: Oral mucosa pink and moist, no tonsillar enlargement or exudate. Posterior pharynx patent and nonerythematous, no uvula deviation or swelling. Normal phonation.  Eyes:     Conjunctiva/sclera: Conjunctivae normal.  Cardiovascular:     Rate and Rhythm: Normal rate and regular rhythm.  Pulmonary:     Effort: Pulmonary effort is normal. No respiratory distress.     Comments: Breathing comfortably at rest, CTABL, no wheezing, rales or other adventitious sounds auscultated  Abdominal:     General: There is no distension.  Musculoskeletal:        General: Normal range of motion.     Cervical back: Neck supple.  Skin:    General: Skin is warm and dry.  Neurological:     Mental Status: He is alert and oriented to person, place, and time.     UC Treatments / Results  Labs (all labs ordered are listed, but only abnormal results are displayed) Labs Reviewed  CULTURE, GROUP A STREP ([place])  NOVEL CORONAVIRUS, NAA  POCT RAPID STREP A (OFFICE)    EKG   Radiology No results found.  Procedures Procedures (including critical care time)  Medications Ordered in UC Medications - No data to display  Initial Impression / Assessment and Plan / UC Course  I have reviewed the triage vital signs and the nursing notes.  Pertinent labs & imaging results that were available during my care of the patient were reviewed by me and considered in my medical decision making (see chart for details).     Viral URI with cough-strep negative, Covid test  pending, exam reassuring, lungs clear to auscultation, suspect viral  etiology and recommend symptomatic and supportive care at this time.  Recommendations provided.  Rest and fluids.  Continue to monitor.  Discussed strict return precautions. Patient verbalized understanding and is agreeable with plan.  Final Clinical Impressions(s) / UC Diagnoses   Final diagnoses:  Viral URI with cough     Discharge Instructions      Strep test negative, COVID test pending, monitor MyChart for results Rest and fluids Continue Tylenol and ibuprofen May begin daily antihistamine-daily cetirizine/Zyrtec or loratadine/Claritin Flonase nasal spray 1 to 2 spray in each nostril daily May use over-the-counter Mucinex or Sudafed to further relieve congestion May use Tessalon for cough or over-the-counter Robitussin, Delsym or Dimetapp Please follow-up if not improving or worsening     ED Prescriptions     Medication Sig Dispense Auth. Provider   fluticasone (FLONASE) 50 MCG/ACT nasal spray Place 1-2 sprays into both nostrils daily. 16 g Evelen Vazguez C, PA-C   benzonatate (TESSALON) 200 MG capsule Take 1 capsule (200 mg total) by mouth 3 (three) times daily as needed for up to 7 days for cough. 28 capsule Rogers Ditter, [place] C, PA-C      PDMP not reviewed this encounter.   Janith Lima, PA-C 03/16/21 1007

## 2021-03-17 LAB — NOVEL CORONAVIRUS, NAA: SARS-CoV-2, NAA: DETECTED — AB

## 2021-03-17 LAB — SARS-COV-2, NAA 2 DAY TAT

## 2021-03-19 LAB — CULTURE, GROUP A STREP (THRC)
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
## 2021-11-19 ENCOUNTER — Ambulatory Visit (HOSPITAL_COMMUNITY): Payer: Managed Care, Other (non HMO) | Attending: Cardiology

## 2021-11-19 DIAGNOSIS — I712 Thoracic aortic aneurysm, without rupture, unspecified: Secondary | ICD-10-CM | POA: Diagnosis not present

## 2021-11-19 LAB — ECHOCARDIOGRAM COMPLETE
Area-P 1/2: 2.85 cm2
S' Lateral: 3.8 cm

## 2021-12-03 IMAGING — CT CT CARDIAC CORONARY ARTERY CALCIUM SCORE
3 series · 14 of 20 positions shown, 15 images · non-contrast
Comparison: None.
COMPARISON: None.

Addendum:
EXAM:
OVER-READ INTERPRETATION  CT CHEST

The following report is an over-read performed by radiologist Dr.
Mashishi Yaya [REDACTED] on 10/02/2020. This
over-read does not include interpretation of cardiac or coronary
anatomy or pathology. The coronary calcium score interpretation by
the cardiologist is attached.
CLINICAL DATA: Cardiovascular Disease Risk stratification
Coronary Calcium Score
TECHNIQUE: A gated, non-contrast computed tomography scan of the heart was
performed using 3mm slice thickness. Axial images were analyzed on a
dedicated workstation. Calcium scoring of the coronary arteries was
performed using the Agatston method.

[Series 2: casc 3.0 bv41 2 bestdiast 71 % · axial · 0.48mm/px · z∈[+1298,+1406]mm · 4 of 60 slices shown, 5 images]
[im 12/60  vessel]
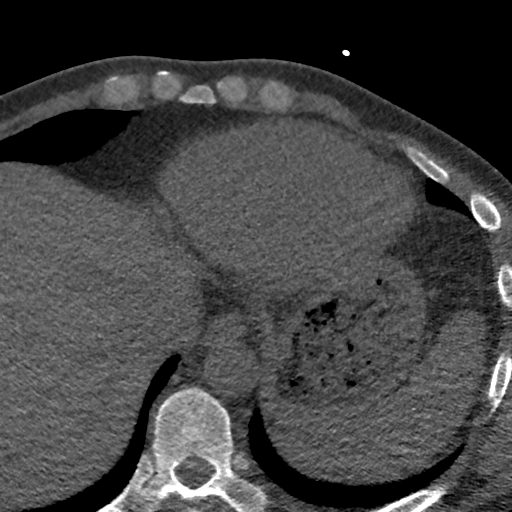
[im 12/60  lung]
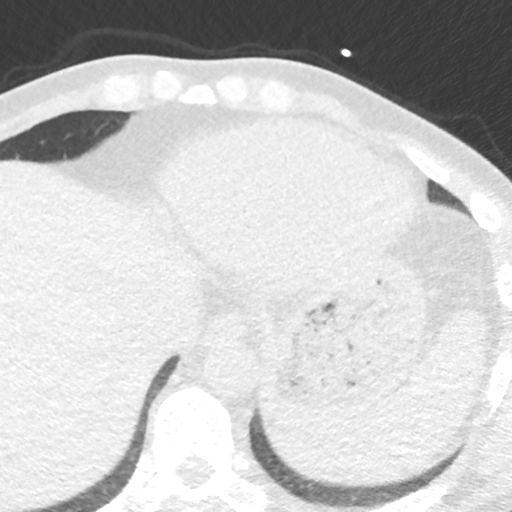
[im 24/60  vessel]
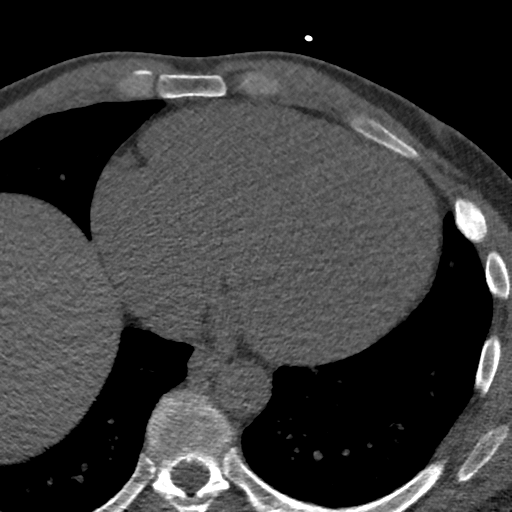
[im 36/60  vessel]
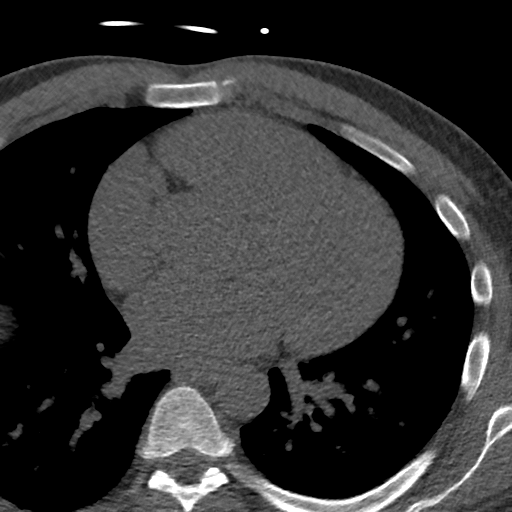
[im 48/60  vessel]
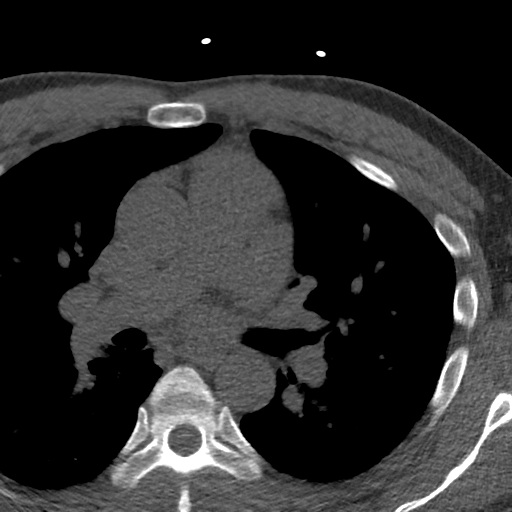

[Series 3: lung 71 % · axial · 0.68mm/px · z∈[+1292,+1412]mm · 5 of 60 slices shown]
[im 10/60  lung]
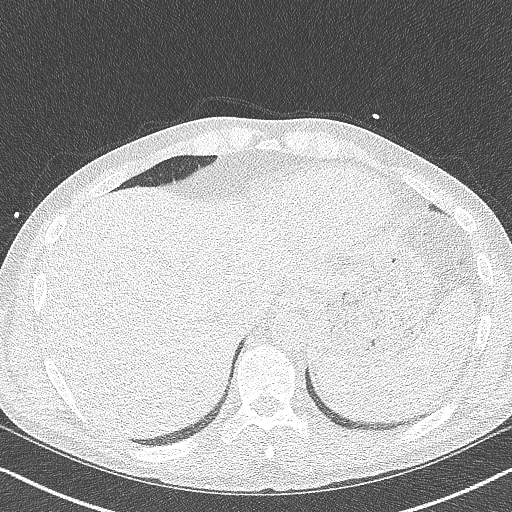
[im 20/60  lung]
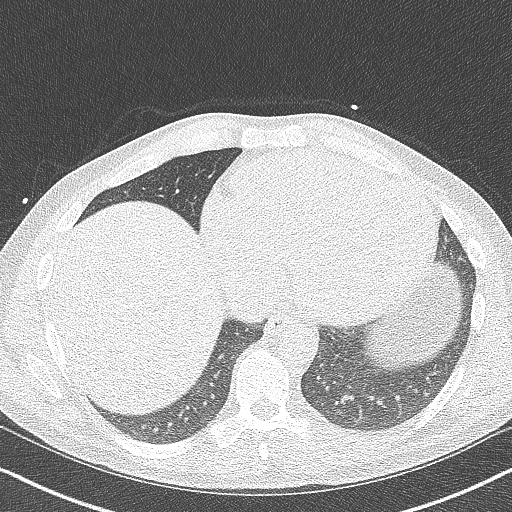
[im 30/60  lung]
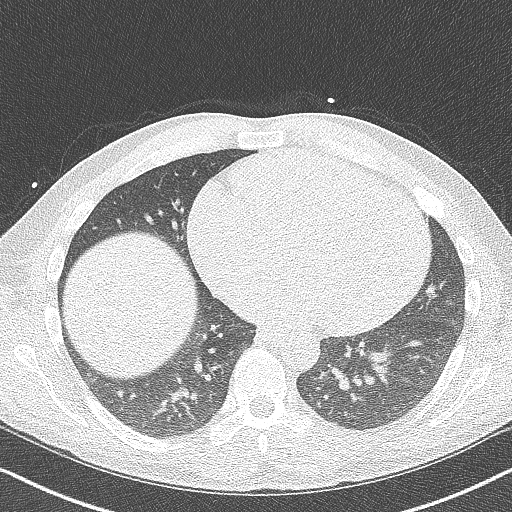
[im 40/60  lung]
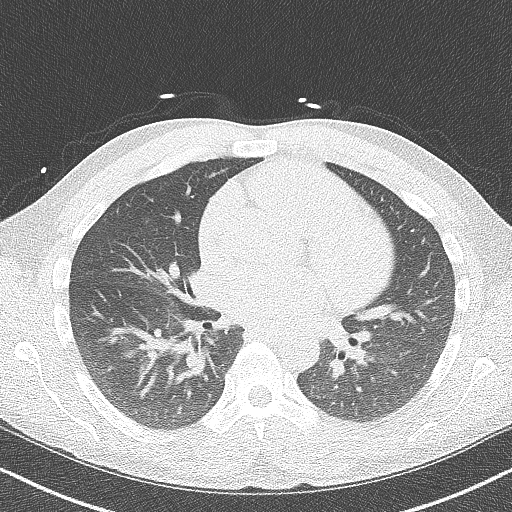
[im 50/60  lung]
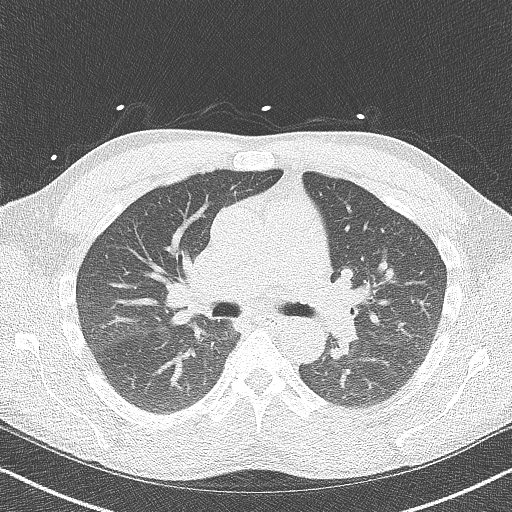

[Series 4: lung st 71 % · axial · 0.68mm/px · z∈[+1292,+1412]mm · 5 of 60 slices shown]
[im 10/60  lung]
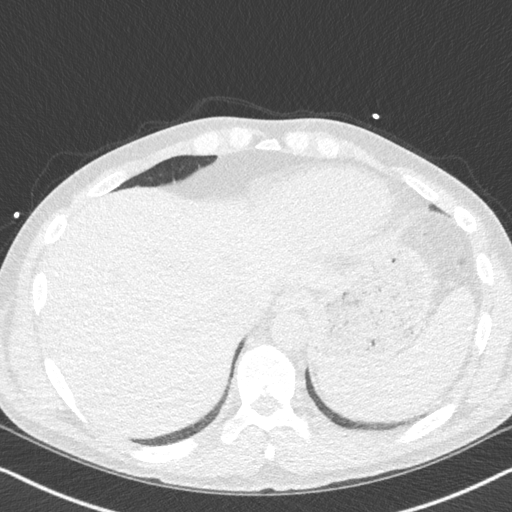
[im 20/60  lung]
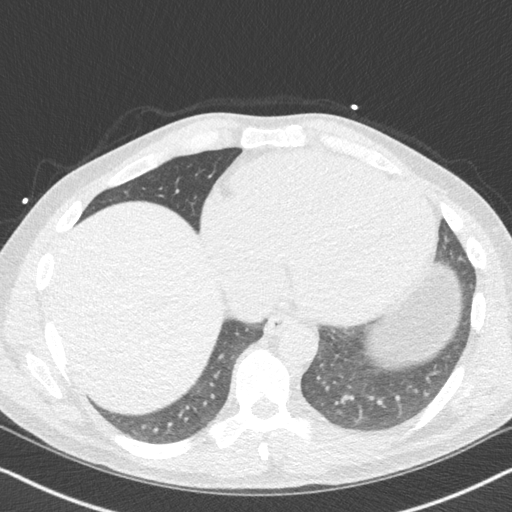
[im 30/60  lung]
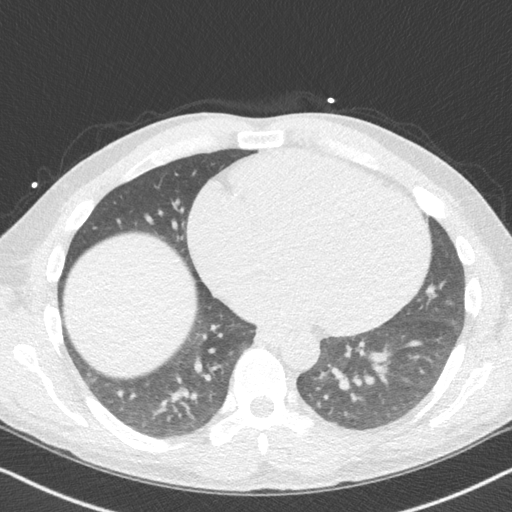
[im 40/60  lung]
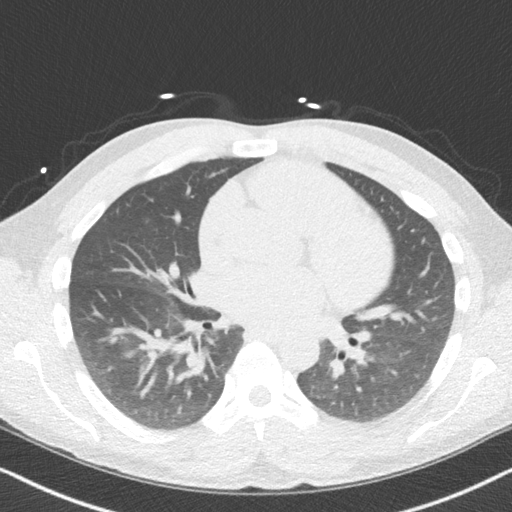
[im 50/60  lung]
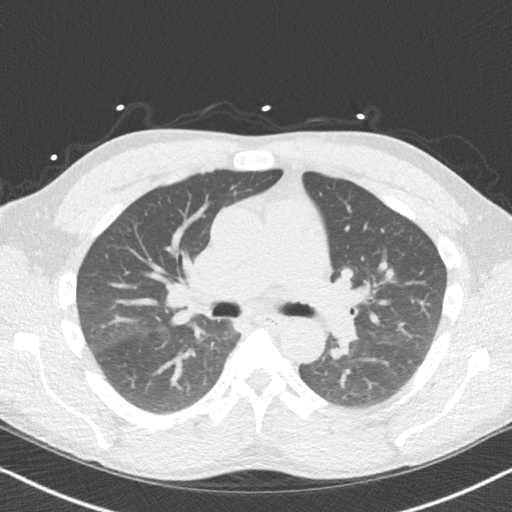

[14 of 20 positions shown; findings below may reference images not displayed]

FINDINGS: Within the visualized portions of the thorax there are no suspicious
appearing pulmonary nodules or masses, there is no acute
consolidative airspace disease, no pleural effusions, no
pneumothorax and no lymphadenopathy. Visualized portions of the
upper abdomen are unremarkable. There are no aggressive appearing
lytic or blastic lesions noted in the visualized portions of the
skeleton.
IMPRESSION: 1. No significant incidental noncardiac findings are noted.
FINDINGS: Coronary arteries: Normal origins.

Coronary Calcium Score:

Left main: 0

Left anterior descending artery:

Left circumflex artery: 0

Right coronary artery:

Total:

Percentile: 93rd - based on age 46

Pericardium: Normal.

Ascending Aorta: Normal caliber.

Non-cardiac: See separate report from [REDACTED].
IMPRESSION: Coronary calcium score of 72.6. This was 93rd percentile based on
age 46 (lowest MESA age comparator).



If CAC=0, it is reasonable to withhold statin therapy and reassess
in 5 to 10 years, as long as higher risk conditions are absent
(diabetes mellitus, family history of premature CHD in first degree
relatives (males <55 years; females <65 years), cigarette smoking,
or LDL >=190 mg/dL).

If CAC is 1 to 99, it is reasonable to initiate statin therapy for
patients >=55 years of age.

If CAC is >=100 or >=75th percentile, it is reasonable to initiate
statin therapy at any age.

Cardiology referral should be considered for patients with CAC
scores >=400 or >=75th percentile.

*2538 AHA/ACC/AACVPR/AAPA/ABC/SEHUN/HUIJIE/ECOGREENAUTOCLEAN/[place]/MISTRY/YLIANA/ZARAH MED
Guideline on the Management of Blood Cholesterol: A Report of the
American College of Cardiology/American Heart Association Task Force
on Clinical Practice Guidelines. J Am Coll Cardiol.
5637;73(24):5078-5688.

*** End of Addendum ***
EXAM:
OVER-READ INTERPRETATION  CT CHEST

The following report is an over-read performed by radiologist Dr.
Mashishi Yaya [REDACTED] on 10/02/2020. This
over-read does not include interpretation of cardiac or coronary
anatomy or pathology. The coronary calcium score interpretation by
the cardiologist is attached.
FINDINGS: Within the visualized portions of the thorax there are no suspicious
appearing pulmonary nodules or masses, there is no acute
consolidative airspace disease, no pleural effusions, no
pneumothorax and no lymphadenopathy. Visualized portions of the
upper abdomen are unremarkable. There are no aggressive appearing
lytic or blastic lesions noted in the visualized portions of the
skeleton.
IMPRESSION: 1. No significant incidental noncardiac findings are noted.

## 2022-03-14 ENCOUNTER — Encounter: Payer: Self-pay | Admitting: *Deleted

## 2022-03-31 ENCOUNTER — Ambulatory Visit (INDEPENDENT_AMBULATORY_CARE_PROVIDER_SITE_OTHER): Payer: Managed Care, Other (non HMO) | Admitting: Family Medicine

## 2022-03-31 ENCOUNTER — Encounter: Payer: Self-pay | Admitting: Family Medicine

## 2022-03-31 VITALS — BP 110/70 | HR 51 | Ht 74.0 in | Wt 210.6 lb

## 2022-03-31 DIAGNOSIS — J939 Pneumothorax, unspecified: Secondary | ICD-10-CM | POA: Diagnosis not present

## 2022-03-31 DIAGNOSIS — I712 Thoracic aortic aneurysm, without rupture, unspecified: Secondary | ICD-10-CM | POA: Insufficient documentation

## 2022-03-31 DIAGNOSIS — E785 Hyperlipidemia, unspecified: Secondary | ICD-10-CM

## 2022-03-31 DIAGNOSIS — Z1211 Encounter for screening for malignant neoplasm of colon: Secondary | ICD-10-CM

## 2022-03-31 DIAGNOSIS — I7121 Aneurysm of the ascending aorta, without rupture: Secondary | ICD-10-CM

## 2022-03-31 DIAGNOSIS — R9431 Abnormal electrocardiogram [ECG] [EKG]: Secondary | ICD-10-CM

## 2022-03-31 DIAGNOSIS — R931 Abnormal findings on diagnostic imaging of heart and coronary circulation: Secondary | ICD-10-CM

## 2022-03-31 DIAGNOSIS — Z0000 Encounter for general adult medical examination without abnormal findings: Secondary | ICD-10-CM

## 2022-03-31 NOTE — Patient Instructions (Addendum)
[facility] contact Please call to schedule visit and/or procedure Address: [address], [place], [place] [address] Phone: [phone]   Please stop by lab before you go If you have mychart- we will send your results within 3 business days of [place] receiving them.  If you do not have mychart- we will call you about results within 5 business days of [place] receiving them.  *please also note that you will see labs on mychart as soon as they post. I will later go in and write notes on them- will say "notes from Dr. Yong Channel"    Recommended follow up: Return in about 1 year (around 04/01/2023) for physical or sooner if needed.Schedule b4 you leave.

## 2022-03-31 NOTE — Progress Notes (Signed)
Phone: [phone]    Subjective:  Patient presents today for their annual physical. Chief complaint-noted.   See problem oriented charting- ROS- full  review of systems was completed and negative  Per full ROS sheet completed by patient  The following were reviewed and entered/updated in epic: Past Medical History:  Diagnosis Date   Chicken pox    Guillain Barr syndrome [facility])    sophomore year of college. never went past shins and into hands.    Patient Active Problem List   Diagnosis Date Noted   History of Guillain-Barre syndrome 12/20/2018    Priority: Low   Tinea versicolor 11/10/2016    Priority: Low   Elevated coronary artery calcium score 10/05/2020   Abnormal EKG    Pneumothorax on left 06/05/2020   Past Surgical History:  Procedure Laterality Date   [place]     SHOULDER ARTHROSCOPY Right    december 2015   VASECTOMY      Family History  Problem Relation Age of Onset   CAD Paternal 70    CAD Paternal Grandfather    CAD Paternal Uncle    Healthy Mother    Hypertension Father    Diabetes Father        Type II   Heart disease Father        arrhythmia- 3 ablations. not sure which type.    Healthy Sister    Multiple sclerosis Brother    Healthy Daughter    Alcohol abuse Paternal Aunt    Alcohol abuse Paternal Uncle    Heart attack Paternal Uncle        after age 36    Alcohol abuse Paternal Grandmother        passed age 71   Heart disease Paternal Grandmother        thinks had heart attack   Hyperlipidemia Paternal Grandfather    Early death Paternal Grandfather        24 from heart attack   Hypertension Paternal Grandfather    Healthy Daughter     Medications- reviewed and updated Current Outpatient Medications  Medication Sig Dispense Refill   fluticasone (FLONASE) 50 MCG/ACT nasal spray Place 1-2 sprays into both nostrils daily. (Patient not taking: Reported on 03/31/2022) 16 g 0   No current facility-administered medications  for this visit.    Allergies-reviewed and updated Allergies  Allergen Reactions   Cephalexin Hives    Patient reported hives    Social History   Social History Narrative   Married. 2 children 65 and 40 year old daughters 3/20222. Oldest at [employer] (will grad year early) and youngest at [employer].    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    Prior- Works in [employer]- one of top 7 folks under the [place].    Undergrad at [employer] at [employer]: cycling, rare golf. Enjoys the outdoors. Working in the yard.       Objective:  BP 110/70   Pulse (!) 51   Ht '6\' 2"'$  (1.88 m)   Wt 210 lb 9.6 oz (95.5 kg)   SpO2 97%   BMI 27.04 kg/m  Gen: NAD, resting comfortably HEENT: Mucous membranes are moist. Oropharynx normal Neck: no thyromegaly CV: Bradycardic but regular no murmurs rubs or gallops Lungs: CTAB no crackles, wheeze, rhonchi Abdomen:  soft/nontender/nondistended/normal bowel sounds. No rebound or guarding.  Ext: no edema Skin: warm, dry Neuro: grossly normal, moves all extremities, PERRLA     Assessment and Plan:  46 y.o. male presenting for annual physical.  Health Maintenance counseling: 1. Anticipatory guidance: Patient counseled regarding regular dental exams -q6 months, eye exams - yearly,  avoiding smoking and second hand smoke , limiting alcohol to 2 beverages per day- down some in last 6 months 2-3 nights a week perhaps 1 drink, no illicit drugs   2. Risk factor reduction:  Advised patient of need for regular exercise and diet rich and fruits and vegetables to reduce risk of heart attack and stroke.  Exercise- riding less as busy event season recently but back to daily at this point .  Diet/weight management-reasonably healthy weight- from cycling standpoint would like to be 195 on home scales- only 8 lbs  off at home. .  Wt Readings from Last 3 Encounters:  03/31/22 210 lb 9.6 oz (95.5 kg)  11/17/20 209 lb 6.4 oz (95 kg)  09/03/20 210 lb 3.2 oz (95.3 kg)  3. Immunizations/screenings/ancillary studies- no flu shot with GBS history Immunization History  Administered Date(s) Administered   PFIZER(Purple Top)SARS-COV-2 Vaccination 07/12/2019, 08/02/2019, 04/07/2020   Tdap 11/10/2016, 06/05/2020  4. Prostate cancer screening-   No family history, start at age 75    5. Colon cancer screening -  no family history, refer to GI today for colonoscopy- considered cologuard   6. Skin cancer screening/prevention-has seen Dr. Nevada Crane in the past- no recent issues. advised regular sunscreen use- improved. Denies worrisome, changing, or new skin lesions.  7. Testicular cancer screening- advised monthly self exams   8. STD screening- patient opts out as monogamous  9.  Never smoker  Status of chronic or acute concerns   #Elevated coronary artery calcium score-10/02/2020 ct cardiac scoring of >72.6 at 93% for age S: Medication:none- cardiology was ok with dietary changes rechecking cholesterol - per Dr. Gwenlyn Found on 11/17/20  -prior to 2023 labs- cut down on cheeses and eggs- fresh eat and veggies with most meals. Cut processed carbs. Had been doing 3-4 eggs for breakfast everyday- also a lot of cheese  A/P:  With elevated coronary artery calcium score we discussed pushing LDL under 70-he is hoping to have done this with dietary changes that we did discuss options for more aggressive dietary changes such as vegan diet which does not fit his lifestyle well -wants to avoid meds at all costs - We discussed if remains off meds potentially repeating test in 5 years  #Thoracic aortic aneurysm S: Knows to avoid quinolones.  Annual checks with cardiology with echocardiogram A/P: Likely stable-blood pressure well controlled-continue to monitor with cardiology   Recommended follow up: Return in about 1 year (around  04/01/2023) for physical or sooner if needed.Schedule b4 you leave.  Lab/Order associations:NOT fasting   ICD-10-CM   1. Preventative health care  Z00.00 CBC    Differential    Comprehensive metabolic panel    TSH    2. Screen for colon cancer  Z12.11 Ambulatory referral to Gastroenterology    3. Hyperlipidemia, unspecified hyperlipidemia type  E78.5 Lipid panel      No orders of the defined types were placed in this encounter.   Return precautions advised.   Garret Reddish, MD

## 2022-04-01 LAB — LIPID PANEL
Cholesterol: 166 mg/dL (ref 0–200)
HDL: 75.7 mg/dL (ref >39.00)
LDL Cholesterol: 63 mg/dL (ref 0–99)
NonHDL: 90.52
Total CHOL/HDL Ratio: 2
Triglycerides: 139 mg/dL (ref 0.0–149.0)
VLDL: 27.8 mg/dL (ref 0.0–40.0)

## 2022-04-01 LAB — COMPREHENSIVE METABOLIC PANEL
ALT: 10 U/L (ref 0–53)
AST: 14 U/L (ref 0–37)
Albumin: 4.5 g/dL (ref 3.5–5.2)
Alkaline Phosphatase: 53 U/L (ref 39–117)
BUN: 21 mg/dL (ref 6–23)
CO2: 33 mEq/L — ABNORMAL HIGH (ref 19–32)
Calcium: 9.4 mg/dL (ref 8.4–10.5)
Chloride: 100 mEq/L (ref 96–112)
Creatinine, Ser: 0.9 mg/dL (ref 0.40–1.50)
GFR: 103.29 mL/min (ref >60.00)
Glucose, Bld: 82 mg/dL (ref 70–99)
Potassium: 4 mEq/L (ref 3.5–5.1)
Sodium: 139 mEq/L (ref 135–145)
Total Bilirubin: 0.6 mg/dL (ref 0.2–1.2)
Total Protein: 7 g/dL (ref 6.0–8.3)

## 2022-04-01 LAB — CBC
HCT: 38.5 % — ABNORMAL LOW (ref 39.0–52.0)
Hemoglobin: 13.2 g/dL (ref 13.0–17.0)
MCHC: 34.1 g/dL (ref 30.0–36.0)
MCV: 93 fl (ref 78.0–100.0)
Platelets: 201 10*3/uL (ref 150.0–400.0)
RBC: 4.14 Mil/uL — ABNORMAL LOW (ref 4.22–5.81)
RDW: 12.3 % (ref 11.5–15.5)
WBC: 4.9 10*3/uL (ref 4.0–10.5)

## 2022-04-01 LAB — TSH: TSH: 1.73 u[IU]/mL (ref 0.35–5.50)

## 2022-04-08 ENCOUNTER — Encounter: Payer: Self-pay | Admitting: Gastroenterology

## 2022-04-22 ENCOUNTER — Ambulatory Visit (AMBULATORY_SURGERY_CENTER): Payer: Self-pay

## 2022-04-22 VITALS — Ht 74.0 in | Wt 206.0 lb

## 2022-04-22 DIAGNOSIS — Z1211 Encounter for screening for malignant neoplasm of colon: Secondary | ICD-10-CM

## 2022-04-22 MED ORDER — NA SULFATE-K SULFATE-MG SULF 17.5-3.13-1.6 GM/177ML PO SOLN: 1.0000 | Freq: Once | ORAL | 0 refills | Status: AC

## 2022-04-22 NOTE — Progress Notes (Signed)
No egg or soy allergy known to patient  No issues known to pt with past sedation with any surgeries or procedures---other than PONV; Patient denies ever being told they had issues or difficulty with intubation-----other than PONV; No FH of Malignant Hyperthermia; Pt is not on diet pills; Pt is not on home 02;  Pt is not on blood thinners;  Pt denies issues with constipation;  No A fib or A flutter; Have any cardiac testing pending--NO Pt instructed to use Singlecare.com or GoodRx for a price reduction on prep;   Insurance verified during PV appt=Cigna  Previsit completed and red dot placed by patient's name on their procedure day (on provider's schedule);    Good Rx coupon given to patient during PV appt;

## 2022-05-23 ENCOUNTER — Ambulatory Visit: Payer: Managed Care, Other (non HMO) | Admitting: Physician Assistant

## 2022-05-23 ENCOUNTER — Encounter: Payer: Self-pay | Admitting: Physician Assistant

## 2022-05-23 VITALS — BP 120/74 | HR 55 | Temp 97.7°F | Ht 74.0 in | Wt 213.0 lb

## 2022-05-23 DIAGNOSIS — R052 Subacute cough: Secondary | ICD-10-CM | POA: Diagnosis not present

## 2022-05-23 MED ORDER — AZELASTINE HCL 0.1 % NA SOLN: 1.0000 | Freq: Two times a day (BID) | NASAL | 1 refills | Status: DC

## 2022-05-23 MED ORDER — DOXYCYCLINE HYCLATE 100 MG PO TABS: 100.0000 mg | ORAL_TABLET | Freq: Two times a day (BID) | ORAL | 0 refills | Status: DC
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
## 2022-05-24 ENCOUNTER — Encounter: Payer: Self-pay | Admitting: Gastroenterology

## 2022-05-27 ENCOUNTER — Ambulatory Visit (AMBULATORY_SURGERY_CENTER): Payer: Managed Care, Other (non HMO) | Admitting: Gastroenterology

## 2022-05-27 ENCOUNTER — Ambulatory Visit: Payer: Managed Care, Other (non HMO) | Admitting: Family Medicine

## 2022-05-27 ENCOUNTER — Encounter: Payer: Self-pay | Admitting: Gastroenterology

## 2022-05-27 VITALS — BP 103/71 | HR 58 | Temp 97.3°F | Resp 12 | Ht 74.0 in | Wt 206.0 lb

## 2022-05-27 DIAGNOSIS — D123 Benign neoplasm of transverse colon: Secondary | ICD-10-CM

## 2022-05-27 DIAGNOSIS — Z1211 Encounter for screening for malignant neoplasm of colon: Secondary | ICD-10-CM | POA: Diagnosis present

## 2022-05-27 MED ORDER — SODIUM CHLORIDE 0.9 % IV SOLN
500.0000 mL | Freq: Once | INTRAVENOUS | Status: DC
Start: 2022-05-27 — End: 2022-05-27
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
## 2022-05-27 NOTE — Progress Notes (Signed)
Called to room to assist during endoscopic procedure.  Patient ID and intended procedure confirmed with present staff. Received instructions for my participation in the procedure from the performing physician.  

## 2022-05-27 NOTE — Op Note (Signed)
[place] Patient Name: Riley Garcia Procedure Date: 05/27/2022 9:06 AM MRN: [id number] Endoscopist: Jackquline Denmark , MD, [id number] Age: 46 Referring MD:  Date of Birth: 11/21/1975 Gender: Male Account #: [id number] Procedure:                Colonoscopy Indications:              Screening for colorectal malignant neoplasm Medicines:                Monitored Anesthesia Care Procedure:                Pre-Anesthesia Assessment:                           - Prior to the procedure, a History and Physical                            was performed, and patient medications and                            allergies were reviewed. The patient's tolerance of                            previous anesthesia was also reviewed. The risks                            and benefits of the procedure and the sedation                            options and risks were discussed with the patient.                            All questions were answered, and informed consent                            was obtained. Prior Anticoagulants: The patient has                            taken no anticoagulant or antiplatelet agents. ASA                            Grade Assessment: II - A patient with mild systemic                            disease. After reviewing the risks and benefits,                            the patient was deemed in satisfactory condition to                            undergo the procedure.                           After obtaining informed consent, the colonoscope  was passed under direct vision. Throughout the                            procedure, the patient's blood pressure, pulse, and                            oxygen saturations were monitored continuously. The                            CF [id number] #[id number] was introduced through the anus                            and advanced to the 2 cm into the ileum. The                            colonoscopy was  performed without difficulty. The                            patient tolerated the procedure well. The quality                            of the bowel preparation was adequate to identify                            polyps. The terminal ileum, ileocecal valve,                            appendiceal orifice, and rectum were photographed. Scope In: 9:13:12 AM Scope Out: 9:29:00 AM Scope Withdrawal Time: 0 hours 11 minutes 49 seconds  Total Procedure Duration: 0 hours 15 minutes 48 seconds  Findings:                 A 6 mm polyp was found in the mid transverse colon.                            The polyp was sessile. The polyp was removed with a                            cold snare. Resection and retrieval were complete.                           Multiple medium-mouthed diverticula were found in                            the sigmoid colon, few in descending colon and                            ascending colon.                           Non-bleeding internal hemorrhoids were found during                            retroflexion. The hemorrhoids  were small and Grade                            I (internal hemorrhoids that do not prolapse).                           The terminal ileum appeared normal.                           The exam was otherwise without abnormality on                            direct and retroflexion views. Complications:            No immediate complications. Estimated Blood Loss:     Estimated blood loss: none. Impression:               - One 6 mm polyp in the mid transverse colon,                            removed with a cold snare. Resected and retrieved.                           - Pancolonic diverticulosis predominantly in the                            sigmoid colon.                           - Non-bleeding internal hemorrhoids.                           - The examined portion of the ileum was normal.                           - The examination was otherwise normal  on direct                            and retroflexion views. Recommendation:           - Patient has a contact number available for                            emergencies. The signs and symptoms of potential                            delayed complications were discussed with the                            patient. Return to normal activities tomorrow.                            Written discharge instructions were provided to the                            patient.                           -  Resume previous diet.                           - Continue present medications.                           - Await pathology results.                           - Repeat colonoscopy for surveillance based on                            pathology results.                           - The findings and recommendations were discussed                            with the patient's family. Jackquline Denmark, MD 05/27/2022 9:35:30 AM This report has been signed electronically.

## 2022-05-27 NOTE — Patient Instructions (Addendum)
- Patient has a contact number available for emergencies. The signs and symptoms of potential delayed complications were discussed with the patient. Return to normal activities tomorrow. Written discharge instructions were provided to the patient. - Resume previous diet. - Continue present medications. - Await pathology results. - Repeat colonoscopy for surveillance based on pathology results. - The findings and recommendations were discussed with the patient's family. -Handout on polyps and hemorrhoids provided   YOU HAD AN ENDOSCOPIC PROCEDURE TODAY AT [place]:   Refer to the procedure report that was given to you for any specific questions about what was found during the examination.  If the procedure report does not answer your questions, please call your gastroenterologist to clarify.  If you requested that your care partner not be given the details of your procedure findings, then the procedure report has been included in a sealed envelope for you to review at your convenience later.  YOU SHOULD EXPECT: Some feelings of bloating in the abdomen. Passage of more gas than usual.  Walking can help get rid of the air that was put into your GI tract during the procedure and reduce the bloating. If you had a lower endoscopy (such as a colonoscopy or flexible sigmoidoscopy) you may notice spotting of blood in your stool or on the toilet paper. If you underwent a bowel prep for your procedure, you may not have a normal bowel movement for a few days.  Please Note:  You might notice some irritation and congestion in your nose or some drainage.  This is from the oxygen used during your procedure.  There is no need for concern and it should clear up in a day or so.  SYMPTOMS TO REPORT IMMEDIATELY:  Following lower endoscopy (colonoscopy or flexible sigmoidoscopy):  Excessive amounts of blood in the stool  Significant tenderness or worsening of abdominal pains  Swelling of the abdomen  that is new, acute  Fever of 100F or higher  For urgent or emergent issues, a gastroenterologist can be reached at any hour by calling [phone]. Do not use MyChart messaging for urgent concerns.    DIET:  We do recommend a small meal at first, but then you may proceed to your regular diet.  Drink plenty of fluids but you should avoid alcoholic beverages for 24 hours.  ACTIVITY:  You should plan to take it easy for the rest of today and you should NOT DRIVE or use heavy machinery until tomorrow (because of the sedation medicines used during the test).    FOLLOW UP: Our staff will call the number listed on your records the next business day following your procedure.  We will call around 7:15- 8:00 am to check on you and address any questions or concerns that you may have regarding the information given to you following your procedure. If we do not reach you, we will leave a message.     If any biopsies were taken you will be contacted by phone or by letter within the next 1-3 weeks.  Please call us at [phone] if you have not heard about the biopsies in 3 weeks.    SIGNATURES/CONFIDENTIALITY: You and/or your care partner have signed paperwork which will be entered into your electronic medical record.  These signatures attest to the fact that that the information above on your After Visit Summary has been reviewed and is understood.  Full responsibility of the confidentiality of this discharge information lies with you and/or your care-partner.

## 2022-05-27 NOTE — Progress Notes (Signed)
A and O x3. Report to RN. Tolerated MAC anesthesia well. 

## 2022-05-30 ENCOUNTER — Telehealth: Payer: Self-pay | Admitting: *Deleted

## 2022-05-30 NOTE — Telephone Encounter (Signed)
  Follow up Call-     05/27/2022    8:19 AM 05/27/2022    8:11 AM  Call back number  Post procedure Call Back phone  # [phone]   Permission to leave phone message  Yes     Patient questions:  Do you have a fever, pain , or abdominal swelling? No. Pain Score  0 *  Have you tolerated food without any problems? Yes.    Have you been able to return to your normal activities? Yes.    Do you have any questions about your discharge instructions: Diet   No. Medications  No. Follow up visit  No.  Do you have questions or concerns about your Care? No.  Actions: * If pain score is 4 or above: No action needed, pain <4.

## 2022-06-05 ENCOUNTER — Encounter: Payer: Self-pay | Admitting: Gastroenterology

## 2022-08-18 ENCOUNTER — Encounter: Payer: Self-pay | Admitting: Family Medicine

## 2022-08-23 ENCOUNTER — Ambulatory Visit: Payer: Managed Care, Other (non HMO) | Admitting: Family Medicine

## 2022-08-23 ENCOUNTER — Encounter: Payer: Self-pay | Admitting: Family Medicine

## 2022-08-23 VITALS — BP 90/62 | HR 43 | Temp 97.1°F | Ht 74.0 in | Wt 192.4 lb

## 2022-08-23 DIAGNOSIS — B029 Zoster without complications: Secondary | ICD-10-CM

## 2022-08-23 NOTE — Progress Notes (Signed)
  Phone [phone] In person visit   Subjective:   Riley Garcia is a 47 y.o. year old very pleasant male patient who presents for/with See problem oriented charting Chief Complaint  Patient presents with   Rash    Pt c/o rash that has developed on his trunk and thinks it may be shingles.    Past Medical History-  Patient Active Problem List   Diagnosis Date Noted   Thoracic aortic aneurysm ([place]) 03/31/2022    Priority: High   Elevated coronary artery calcium score 10/05/2020    Priority: High   Abnormal EKG     Priority: Low   Pneumothorax on left 06/05/2020    Priority: Low   History of Guillain-Barre syndrome 12/20/2018    Priority: Low   Tinea versicolor 11/10/2016    Priority: Low    Medications- reviewed and updated No current outpatient medications on file.   No current facility-administered medications for this visit.     Objective:  BP 90/62   Pulse (!) 43   Temp (!) 97.1 F (36.2 C)   Ht '6\' 2"'$  (1.88 m)   Wt 192 lb 6.4 oz (87.3 kg)   SpO2 96%   BMI 24.70 kg/m  Gen: NAD, resting comfortably CV: RRR no murmurs rubs or gallops Skin: warm, dry, rash in band like distribution disbursed along approximately t6-t7- patch on anterior chest clearly vesicular- wraps around to back with many lesions crusted over     Assessment and Plan   #Rash S: Patient developed a rash on his left rib cage/trunk approximately March 23.  Started out with pain/tenderness and he thought it could be related to cycling which had increased recently at first.  Then later developed a few small rashes in the area as well as well as some itchiness.  He was concerned about shingles.  Took Tylenol sparingly to help with pain  At times pain 0/10 but if something brushes on area can get up to 5-7/10- when it flares usually short term and will fade back away. Denies recent intense stress- was actually vacation- other than was trying to catch up before he left  ROS-not ill appearing, no  fever/chills. No new medications reported. Not immunocompromised. No mucus membrane involvement.  A/P: Rash likely due to herpes zoster/shingles approximately T6-T7 level-pain is not severe enough that patient wants to pursue treatment outside of Tylenol as needed - He asked great questions about connection with Guillain-Barr syndrome-does appear to have some increased risk - Discussed could get vaccination at age 35 as there is still potential for recurrence-we will consider that later   Recommended follow up: Return for as needed for new, worsening, persistent symptoms.  Lab/Order associations:   ICD-10-CM   1. Herpes zoster without complication  [id number]      Return precautions advised.  Garret Reddish, MD

## 2022-08-23 NOTE — Patient Instructions (Addendum)
Let us know if you change your mind and need symptomatic treatment/pain management  Recommended follow up: Return for as needed for new, worsening, persistent symptoms.

## 2023-02-10 ENCOUNTER — Encounter: Payer: Self-pay | Admitting: Cardiovascular Disease

## 2023-02-10 ENCOUNTER — Ambulatory Visit: Payer: Managed Care, Other (non HMO) | Attending: Cardiovascular Disease | Admitting: Cardiovascular Disease

## 2023-02-10 VITALS — BP 128/72 | HR 48 | Ht 74.0 in | Wt 201.0 lb

## 2023-02-10 DIAGNOSIS — I7121 Aneurysm of the ascending aorta, without rupture: Secondary | ICD-10-CM

## 2023-02-10 DIAGNOSIS — E782 Mixed hyperlipidemia: Secondary | ICD-10-CM | POA: Diagnosis not present

## 2023-02-10 DIAGNOSIS — E785 Hyperlipidemia, unspecified: Secondary | ICD-10-CM | POA: Insufficient documentation

## 2023-02-10 DIAGNOSIS — I77810 Thoracic aortic ectasia: Secondary | ICD-10-CM

## 2023-02-10 DIAGNOSIS — R931 Abnormal findings on diagnostic imaging of heart and coronary circulation: Secondary | ICD-10-CM | POA: Diagnosis not present

## 2023-02-10 NOTE — Assessment & Plan Note (Signed)
History of elevated coronary calcium score 72 although he is completely asymptomatic and very active.  Based on this we focused on his lipid profile which performed 03/31/2022 revealed LDL 63.

## 2023-02-10 NOTE — Assessment & Plan Note (Signed)
History of thoracic aortic aneurysm measuring 41 mm by echo performed in 12/17/2020 which was increased to 44 mm 11/19/2021.  Will recheck a 2D echocardiogram.

## 2023-02-10 NOTE — Assessment & Plan Note (Signed)
History of hyperlipidemia elevated for secondary prevention.  He currently is on no medications with a recent lipid profile performed 03/31/2022 revealing an LDL of 63 as result of dietary modification.

## 2023-02-10 NOTE — Progress Notes (Signed)
02/10/2023 Riley Garcia   [birth year]  [id number]  Primary Physician Durene Cal Aldine Contes, MD Primary Cardiologist: Runell Gess MD Nicholes Calamity, [place]  HPI:  Riley Garcia is a 47 y.o. 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 His wife apparently was diagnosed with multiple sclerosis in 2010 but is clinically stable. He is an active cyclist. He was referred by Dr. Durene Cal because of recent coronary calcium score performed 10/02/2020 which was 72 total with the majority being in the right coronary artery.  I last saw him in the office 11/17/2020.  His only risk factor is family history although most of this is on his father side of the family and not first-degree relatives. He bicycles 10 to 12,000 miles a year and is very active. Is completely asymptomatic. He does have a mildly elevated total cholesterol at 180 although his HDL is 79 and LDL is 83.  Since I saw him 2 years ago he has dramatically altered his diet without resulting decrease in his LDL from 79-63.  He is completely asymptomatic writing 175 to 200 miles a week on his bicycle without symptoms.  His most recent echo performed 11/19/2021 revealed normal LV systolic function, grade 2 diastolic dysfunction LVH and an increase in his ascending thoracic aorta measuring 44 mm which was 41 mm 2 years ago.   No outpatient medications have been marked as taking for the 02/10/23 encounter (Office Visit) with Runell Gess, MD.     Allergies  Allergen Reactions   Cephalexin Hives and Itching    Social History   Socioeconomic History   Marital status: Married    Spouse name: Not on file   Number of children: Not on file   Years of education: Not on file   Highest education level: Not on file  Occupational History   Not on file  Tobacco Use   Smoking status: Never   Smokeless tobacco: Never  Vaping Use   Vaping status: Never Used   Substance and Sexual Activity   Alcohol use: Not Currently    Alcohol/week: 0.0 - 5.0 standard drinks of alcohol    Comment: Social   Drug use: Never   Sexual activity: Yes  Other Topics Concern   Not on file  Social History Narrative   Married. 2 children 39 and 10 year old daughters 3/20222. Oldest at [employer] (will grad year early) and youngest at [employer].    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    Prior- Works in [employer]- one of top 7 folks under the AD.    Undergrad at [employer] at [employer]: cycling, rare golf. Enjoys the outdoors. Working in the yard.    Social Determinants of Health   Financial Resource Strain: Not on file  Food Insecurity: Not on file  Transportation Needs: Not on file  Physical Activity: Not on file  Stress: Not on file  Social Connections: Not on file  Intimate Partner Violence: Not on file     Review of Systems: General: negative for chills, fever, night sweats or weight changes.  Cardiovascular: negative for chest pain, dyspnea on exertion, edema, orthopnea, palpitations, paroxysmal nocturnal dyspnea or shortness of breath Dermatological: negative for  rash Respiratory: negative for cough or wheezing Urologic: negative for hematuria Abdominal: negative for nausea, vomiting, diarrhea, bright red blood per rectum, melena, or hematemesis Neurologic: negative for visual changes, syncope, or dizziness All other systems reviewed and are otherwise negative except as noted above.    Blood pressure 128/72, pulse (!) 48, height 6\' 2"  (1.88 m), weight 201 lb (91.2 kg), SpO2 98%.  General appearance: alert and no distress Neck: no adenopathy, no carotid bruit, no JVD, supple, symmetrical, trachea midline, and thyroid not enlarged, symmetric, no tenderness/mass/nodules Lungs: clear to  auscultation bilaterally Heart: Regular rate and rhythm without murmurs gallops rubs or clicks. Extremities: extremities normal, atraumatic, no cyanosis or edema Pulses: 2+ and symmetric Skin: Skin color, texture, turgor normal. No rashes or lesions Neurologic: Grossly normal  EKG EKG Interpretation Date/Time:  Friday February 10 2023 09:08:10 EDT Ventricular Rate:  43 PR Interval:  212 QRS Duration:  116 QT Interval:  452 QTC Calculation: 381 R Axis:   59  Text Interpretation: Marked sinus bradycardia with 1st degree A-V block Minimal voltage criteria for LVH, may be normal variant ( Sokolow-Lyon ) When compared with ECG of 06-Jun-2020 12:01, No significant change was found Confirmed by Nanetta Batty [phone]) on 02/10/2023 9:27:36 AM    ASSESSMENT AND PLAN:   Elevated coronary artery calcium score History of elevated coronary calcium score 72 although he is completely asymptomatic and very active.  Based on this we focused on his lipid profile which performed 03/31/2022 revealed LDL 63.  Hyperlipidemia History of hyperlipidemia elevated for secondary prevention.  He currently is on no medications with a recent lipid profile performed 03/31/2022 revealing an LDL of 63 as result of dietary modification.  Thoracic aortic aneurysm [facility]) History of thoracic aortic aneurysm measuring 41 mm by echo performed in 12/17/2020 which was increased to 44 mm 11/19/2021.  Will recheck a 2D echocardiogram.     Runell Gess MD [facility], [facility] 02/10/2023 9:42 AM

## 2023-02-10 NOTE — Patient Instructions (Signed)
Medication Instructions:  NO CHANGES  *If you need a refill on your cardiac medications before your next appointment, please call your pharmacy*   Testing/Procedures: Your physician has requested that you have an echocardiogram. Echocardiography is a painless test that uses sound waves to create images of your heart. It provides your doctor with information about the size and shape of your heart and how well your heart's chambers and valves are working. This procedure takes approximately one hour. There are no restrictions for this procedure. Please do NOT wear cologne, perfume, aftershave, or lotions (deodorant is allowed). Please arrive 15 minutes prior to your appointment time.    Follow-Up: At [facility], you and your health needs are our priority.  As part of our continuing mission to provide you with exceptional heart care, we have created designated Provider Care Teams.  These Care Teams include your primary Cardiologist (physician) and Advanced Practice Providers (APPs -  Physician Assistants and Nurse Practitioners) who all work together to provide you with the care you need, when you need it.  We recommend signing up for the patient portal called "MyChart".  Sign up information is provided on this After Visit Summary.  MyChart is used to connect with patients for Virtual Visits (Telemedicine).  Patients are able to view lab/test results, encounter notes, upcoming appointments, etc.  Non-urgent messages can be sent to your provider as well.   To learn more about what you can do with MyChart, go to [email or web address].    Your next appointment:    12 months with Dr. Allyson Sabal

## 2023-03-13 ENCOUNTER — Ambulatory Visit (HOSPITAL_COMMUNITY): Payer: Managed Care, Other (non HMO) | Attending: Cardiology

## 2023-03-13 ENCOUNTER — Other Ambulatory Visit: Payer: Self-pay | Admitting: *Deleted

## 2023-03-13 DIAGNOSIS — R931 Abnormal findings on diagnostic imaging of heart and coronary circulation: Secondary | ICD-10-CM | POA: Diagnosis present

## 2023-03-13 DIAGNOSIS — I77810 Thoracic aortic ectasia: Secondary | ICD-10-CM | POA: Insufficient documentation

## 2023-03-13 DIAGNOSIS — I7121 Aneurysm of the ascending aorta, without rupture: Secondary | ICD-10-CM

## 2023-03-13 LAB — ECHOCARDIOGRAM COMPLETE
Area-P 1/2: 2.54 cm2
S' Lateral: 3.7 cm

## 2023-05-05 ENCOUNTER — Encounter: Payer: Self-pay | Admitting: Family Medicine

## 2023-05-05 ENCOUNTER — Ambulatory Visit (INDEPENDENT_AMBULATORY_CARE_PROVIDER_SITE_OTHER): Payer: Managed Care, Other (non HMO) | Admitting: Family Medicine

## 2023-05-05 VITALS — BP 132/78 | HR 63 | Temp 97.2°F | Ht 74.0 in | Wt 208.8 lb

## 2023-05-05 DIAGNOSIS — I7121 Aneurysm of the ascending aorta, without rupture: Secondary | ICD-10-CM | POA: Diagnosis not present

## 2023-05-05 DIAGNOSIS — E785 Hyperlipidemia, unspecified: Secondary | ICD-10-CM

## 2023-05-05 DIAGNOSIS — Z0000 Encounter for general adult medical examination without abnormal findings: Secondary | ICD-10-CM | POA: Diagnosis not present

## 2023-05-05 DIAGNOSIS — R931 Abnormal findings on diagnostic imaging of heart and coronary circulation: Secondary | ICD-10-CM | POA: Diagnosis not present

## 2023-05-05 LAB — CBC WITH DIFFERENTIAL/PLATELET
Basophils Absolute: 0 10*3/uL (ref 0.0–0.1)
Basophils Relative: 0.7 % (ref 0.0–3.0)
Eosinophils Absolute: 0.1 10*3/uL (ref 0.0–0.7)
Eosinophils Relative: 1.6 % (ref 0.0–5.0)
HCT: 43.5 % (ref 39.0–52.0)
Hemoglobin: 14.5 g/dL (ref 13.0–17.0)
Lymphocytes Relative: 29.5 % (ref 12.0–46.0)
Lymphs Abs: 1.4 10*3/uL (ref 0.7–4.0)
MCHC: 33.3 g/dL (ref 30.0–36.0)
MCV: 94.2 fL (ref 78.0–100.0)
Monocytes Absolute: 0.4 10*3/uL (ref 0.1–1.0)
Monocytes Relative: 8.4 % (ref 3.0–12.0)
Neutro Abs: 2.8 10*3/uL (ref 1.4–7.7)
Neutrophils Relative %: 59.8 % (ref 43.0–77.0)
Platelets: 208 10*3/uL (ref 150.0–400.0)
RBC: 4.62 Mil/uL (ref 4.22–5.81)
RDW: 12.3 % (ref 11.5–15.5)
WBC: 4.6 10*3/uL (ref 4.0–10.5)

## 2023-05-05 LAB — LIPID PANEL
Cholesterol: 181 mg/dL (ref 0–200)
HDL: 72.8 mg/dL (ref >39.00)
LDL Cholesterol: 91 mg/dL (ref 0–99)
NonHDL: 108.47
Total CHOL/HDL Ratio: 2
Triglycerides: 86 mg/dL (ref 0.0–149.0)
VLDL: 17.2 mg/dL (ref 0.0–40.0)

## 2023-05-05 LAB — COMPREHENSIVE METABOLIC PANEL
ALT: 14 U/L (ref 0–53)
AST: 20 U/L (ref 0–37)
Albumin: 4.6 g/dL (ref 3.5–5.2)
Alkaline Phosphatase: 77 U/L (ref 39–117)
BUN: 21 mg/dL (ref 6–23)
CO2: 31 meq/L (ref 19–32)
Calcium: 9.5 mg/dL (ref 8.4–10.5)
Chloride: 100 meq/L (ref 96–112)
Creatinine, Ser: 0.85 mg/dL (ref 0.40–1.50)
GFR: 104.29 mL/min (ref >60.00)
Glucose, Bld: 86 mg/dL (ref 70–99)
Potassium: 4.2 meq/L (ref 3.5–5.1)
Sodium: 137 meq/L (ref 135–145)
Total Bilirubin: 0.6 mg/dL (ref 0.2–1.2)
Total Protein: 7.1 g/dL (ref 6.0–8.3)
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
## 2023-05-05 NOTE — Patient Instructions (Addendum)
Please stop by lab before you go If you have mychart- we will send your results within 3 business days of [place] receiving them.  If you do not have mychart- we will call you about results within 5 business days of [place] receiving them.  *please also note that you will see labs on mychart as soon as they post. I will later go in and write notes on them- will say "notes from Dr. Durene Cal"   No changes today unless labs lead [place] to make changes   Recommended follow up: Return in about 1 year (around 05/04/2024) for physical or sooner if needed.Schedule b4 you leave.

## 2023-05-11 LAB — LIPOPROTEIN A (LPA): Lipoprotein (a): 19 nmol/L (ref <75)

## 2023-06-06 ENCOUNTER — Ambulatory Visit
Admission: RE | Admit: 2023-06-06 | Discharge: 2023-06-06 | Disposition: A | Discharge status: Home / Self Care | Payer: Managed Care, Other (non HMO) | Source: Ambulatory Visit | Attending: Sports Medicine | Admitting: Sports Medicine

## 2023-06-06 ENCOUNTER — Other Ambulatory Visit: Payer: Self-pay | Admitting: Sports Medicine

## 2023-06-06 DIAGNOSIS — G8929 Other chronic pain: Secondary | ICD-10-CM

## 2023-11-10 ENCOUNTER — Encounter: Payer: Self-pay | Admitting: Family Medicine

## 2023-11-10 ENCOUNTER — Ambulatory Visit (INDEPENDENT_AMBULATORY_CARE_PROVIDER_SITE_OTHER): Payer: Managed Care, Other (non HMO) | Admitting: Family Medicine

## 2023-11-10 ENCOUNTER — Ambulatory Visit: Payer: Self-pay | Admitting: Family Medicine

## 2023-11-10 VITALS — BP 136/72 | HR 49 | Temp 97.0°F | Ht 74.0 in | Wt 206.8 lb

## 2023-11-10 DIAGNOSIS — E782 Mixed hyperlipidemia: Secondary | ICD-10-CM | POA: Diagnosis not present

## 2023-11-10 DIAGNOSIS — E663 Overweight: Secondary | ICD-10-CM | POA: Diagnosis not present

## 2023-11-10 DIAGNOSIS — Z125 Encounter for screening for malignant neoplasm of prostate: Secondary | ICD-10-CM

## 2023-11-10 DIAGNOSIS — Z131 Encounter for screening for diabetes mellitus: Secondary | ICD-10-CM | POA: Diagnosis not present

## 2023-11-10 DIAGNOSIS — Z0000 Encounter for general adult medical examination without abnormal findings: Secondary | ICD-10-CM | POA: Diagnosis not present

## 2023-11-10 DIAGNOSIS — B360 Pityriasis versicolor: Secondary | ICD-10-CM

## 2023-11-10 LAB — HEMOGLOBIN A1C: Hgb A1c MFr Bld: 5.2 % (ref 4.6–6.5)

## 2023-11-10 LAB — CBC WITH DIFFERENTIAL/PLATELET
Basophils Absolute: 0 10*3/uL (ref 0.0–0.1)
Basophils Relative: 0.8 % (ref 0.0–3.0)
Eosinophils Absolute: 0.1 10*3/uL (ref 0.0–0.7)
Eosinophils Relative: 1.4 % (ref 0.0–5.0)
HCT: 42.1 % (ref 39.0–52.0)
Hemoglobin: 14.2 g/dL (ref 13.0–17.0)
Lymphocytes Relative: 34.2 % (ref 12.0–46.0)
Lymphs Abs: 1.3 10*3/uL (ref 0.7–4.0)
MCHC: 33.6 g/dL (ref 30.0–36.0)
MCV: 91.3 fl (ref 78.0–100.0)
Monocytes Absolute: 0.3 10*3/uL (ref 0.1–1.0)
Monocytes Relative: 8 % (ref 3.0–12.0)
Neutro Abs: 2.1 10*3/uL (ref 1.4–7.7)
Neutrophils Relative %: 55.6 % (ref 43.0–77.0)
Platelets: 210 10*3/uL (ref 150.0–400.0)
RBC: 4.62 Mil/uL (ref 4.22–5.81)
RDW: 12.8 % (ref 11.5–15.5)
WBC: 3.7 10*3/uL — ABNORMAL LOW (ref 4.0–10.5)

## 2023-11-10 LAB — PSA: PSA: 0.79 ng/mL (ref 0.10–4.00)

## 2023-11-10 LAB — COMPREHENSIVE METABOLIC PANEL WITH GFR
ALT: 12 U/L (ref 0–53)
AST: 17 U/L (ref 0–37)
Albumin: 4.7 g/dL (ref 3.5–5.2)
Alkaline Phosphatase: 62 U/L (ref 39–117)
BUN: 16 mg/dL (ref 6–23)
CO2: 31 meq/L (ref 19–32)
Calcium: 9.6 mg/dL (ref 8.4–10.5)
Chloride: 101 meq/L (ref 96–112)
Creatinine, Ser: 0.72 mg/dL (ref 0.40–1.50)
GFR: 109.25 mL/min (ref >60.00)
Glucose, Bld: 89 mg/dL (ref 70–99)
Potassium: 4.3 meq/L (ref 3.5–5.1)
Sodium: 139 meq/L (ref 135–145)
Total Bilirubin: 0.8 mg/dL (ref 0.2–1.2)
Total Protein: 7 g/dL (ref 6.0–8.3)

## 2023-11-10 LAB — LIPID PANEL
Cholesterol: 190 mg/dL (ref 0–200)
HDL: 82.6 mg/dL (ref >39.00)
LDL Cholesterol: 96 mg/dL (ref 0–99)
NonHDL: 107.01
Total CHOL/HDL Ratio: 2
Triglycerides: 57 mg/dL (ref 0.0–149.0)
VLDL: 11.4 mg/dL (ref 0.0–40.0)
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
## 2023-11-10 NOTE — Patient Instructions (Addendum)
 Please stop by lab before you go If you have mychart- we will send your results within 3 business days of us  receiving them.  If you do not have mychart- we will call you about results within 5 business days of us  receiving them.  *please also note that you will see labs on mychart as soon as they post. I will later go in and write notes on them- will say "notes from Dr. Arlene Ben"   We have placed a referral for you today to Dr Del Favia- please call their # if you do not hear within a week   Recommended follow up: Return in about 1 year (around 11/09/2024) for physical or sooner if needed.Schedule b4 you leave.

## 2024-02-16 ENCOUNTER — Ambulatory Visit: Payer: Self-pay | Admitting: Cardiovascular Disease

## 2024-02-16 ENCOUNTER — Ambulatory Visit (HOSPITAL_COMMUNITY)
Admission: RE | Admit: 2024-02-16 | Discharge: 2024-02-16 | Disposition: A | Discharge status: Home / Self Care | Source: Ambulatory Visit | Attending: Family Medicine | Admitting: Family Medicine

## 2024-02-16 DIAGNOSIS — I7121 Aneurysm of the ascending aorta, without rupture: Secondary | ICD-10-CM | POA: Insufficient documentation

## 2024-02-16 LAB — ECHOCARDIOGRAM COMPLETE
AR max vel: 3.35 cm2
AV Area VTI: 3.68 cm2
AV Area mean vel: 3.28 cm2
AV Mean grad: 2.1 mmHg
AV Peak grad: 3.9 mmHg
Ao pk vel: 0.99 m/s
Area-P 1/2: 2.99 cm2
S' Lateral: 2.8 cm

## 2024-02-16 NOTE — Progress Notes (Signed)
*  PRELIMINARY RESULTS* Echocardiogram 2D Echocardiogram has been performed.  Riley Garcia 02/16/2024, 9:09 AM

## 2024-04-01 ENCOUNTER — Encounter: Payer: Self-pay | Admitting: Cardiovascular Disease

## 2024-04-01 ENCOUNTER — Ambulatory Visit: Attending: Cardiovascular Disease | Admitting: Cardiovascular Disease

## 2024-04-01 VITALS — BP 118/76 | HR 44 | Ht 74.0 in | Wt 207.6 lb

## 2024-04-01 DIAGNOSIS — I77810 Thoracic aortic ectasia: Secondary | ICD-10-CM

## 2024-04-01 DIAGNOSIS — R931 Abnormal findings on diagnostic imaging of heart and coronary circulation: Secondary | ICD-10-CM

## 2024-04-01 DIAGNOSIS — E782 Mixed hyperlipidemia: Secondary | ICD-10-CM | POA: Diagnosis not present

## 2024-04-01 DIAGNOSIS — I7121 Aneurysm of the ascending aorta, without rupture: Secondary | ICD-10-CM

## 2024-04-01 NOTE — Patient Instructions (Signed)
 Medication Instructions:  Your physician recommends that you continue on your current medications as directed. Please refer to the Current Medication list given to you today.  *If you need a refill on your cardiac medications before your next appointment, please call your pharmacy*   Lab Work: Your physician recommends that you return for lab work in: 3 months for FASTING lipid/liver panel  If you have labs (blood work) drawn today and your tests are completely normal, you will receive your results only by: MyChart Message (if you have MyChart) OR A paper copy in the mail If you have any lab test that is abnormal or we need to change your treatment, we will call you to review the results.   Testing/Procedures: Your physician has requested that you have an echocardiogram. Echocardiography is a painless test that uses sound waves to create images of your heart. It provides your doctor with information about the size and shape of your heart and how well your heart's chambers and valves are working. This procedure takes approximately one hour. There are no restrictions for this procedure. Please do NOT wear cologne, perfume, aftershave, or lotions (deodorant is allowed). Please arrive 15 minutes prior to your appointment time.  Please note: We ask at that you not bring children with you during ultrasound (echo/ vascular) testing. Due to room size and safety concerns, children are not allowed in the ultrasound rooms during exams. Our front office staff cannot provide observation of children in our lobby area while testing is being conducted. An adult accompanying a patient to their appointment will only be allowed in the ultrasound room at the discretion of the ultrasound technician under special circumstances. We apologize for any inconvenience. **To do in August 2026**   Dr. Court has ordered a CT coronary calcium score.   Test locations:  [facility]  [facility]  [facility] at [facility]  This is $99 out of pocket.   Coronary CalciumScan A coronary calcium scan is an imaging test used to look for deposits of calcium and other fatty materials (plaques) in the inner lining of the blood vessels of the heart (coronary arteries). These deposits of calcium and plaques can partly clog and narrow the coronary arteries without producing any symptoms or warning signs. This puts a person at risk for a heart attack. This test can detect these deposits before symptoms develop. Tell a health care provider about: Any allergies you have. All medicines you are taking, including vitamins, herbs, eye drops, creams, and over-the-counter medicines. Any problems you or family members have had with anesthetic medicines. Any blood disorders you have. Any surgeries you have had. Any medical conditions you have. Whether you are pregnant or may be pregnant. What are the risks? Generally, this is a safe procedure. However, problems may occur, including: Harm to a pregnant woman and her unborn baby. This test involves the use of radiation. Radiation exposure can be dangerous to a pregnant woman and her unborn baby. If you are pregnant, you generally should not have this procedure done. Slight increase in the risk of cancer. This is because of the radiation involved in the test. What happens before the procedure? No preparation is needed for this procedure. What happens during the procedure? You will undress and remove any jewelry around your neck or chest. You will put on a hospital gown. Sticky electrodes will be placed on your chest. The electrodes will be  connected to an electrocardiogram (ECG) machine to record a tracing of the electrical activity of your heart. A CT scanner will take pictures of your heart. During this time, you will be asked to lie still and hold your breath for 2-3 seconds  while a picture of your heart is being taken. The procedure may vary among health care providers and hospitals. What happens after the procedure? You can get dressed. You can return to your normal activities. It is up to you to get the results of your test. Ask your health care provider, or the department that is doing the test, when your results will be ready. Summary A coronary calcium scan is an imaging test used to look for deposits of calcium and other fatty materials (plaques) in the inner lining of the blood vessels of the heart (coronary arteries). Generally, this is a safe procedure. Tell your health care provider if you are pregnant or may be pregnant. No preparation is needed for this procedure. A CT scanner will take pictures of your heart. You can return to your normal activities after the scan is done. This information is not intended to replace advice given to you by your health care provider. Make sure you discuss any questions you have with your health care provider. Document Released: 12/03/2007 Document Revised: 04/25/2016 Document Reviewed: 04/25/2016 Elsevier Interactive Patient Education  2017 [employer].   Follow-Up: At [facility], you and your health needs are our priority.  As part of our continuing mission to provide you with exceptional heart care, our providers are all part of one team.  This team includes your primary Cardiologist (physician) and Advanced Practice Providers or APPs (Physician Assistants and Nurse Practitioners) who all work together to provide you with the care you need, when you need it.  Your next appointment:   12 month(s)  Provider:   Dorn Lesches, MD

## 2024-04-01 NOTE — Assessment & Plan Note (Signed)
 Small ascending thoracic aortic aneurysm measuring 44 mm by 2D echo performed 02/16/2024.  This will be assessed on an annual basis.

## 2024-04-01 NOTE — Assessment & Plan Note (Signed)
 Elevated coronary calcium score 72 performed 10/02/20 with majority being in the RCA.  He is completely asymptomatic.  He is currently not at goal for secondary prevention on diet alone.  I am going to repeat his coronary calcium score to assess for progression.

## 2024-04-01 NOTE — Progress Notes (Signed)
 04/01/2024 Riley Garcia   1975/09/16  [id number]  Primary Physician Katrinka Garnette KIDD, MD Primary Cardiologist: Dorn JINNY Lesches MD [facility] [place], [place]  HPI:  Riley Garcia is a 48 y.o.   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 His wife apparently was diagnosed with multiple sclerosis in 2010 but is clinically stable. He is an active cyclist. He was referred by Dr. Katrinka because of recent coronary calcium score performed 10/02/2020 which was 72 total with the majority being in the right coronary artery.  I last saw him in the office 02/10/2023.  His only risk factor is family history although most of this is on his father side of the family and not first-degree relatives. He bicycles 10 to 12,000 miles a year and is very active. Is completely asymptomatic. He does have a mildly elevated total cholesterol at 180 although his HDL is 79 and LDL is 83.   Since I saw him a year ago he remained stable.  He has not riding his bicycle as much as he has done in the past but still plans to ride 7000 miles a year.  His most recent 2D echo performed 02/16/2024 revealed his ascending thoracic aorta was stable at 44 mm.  His LDL however did go up from 63-96 over the last year for unclear reasons.  Given his elevated coronary calcium score he is not at goal for secondary prevention.   No outpatient medications have been marked as taking for the 04/01/24 encounter (Office Visit) with Lesches Dorn JINNY, MD.     Allergies  Allergen Reactions   Cephalexin  Hives and Itching    Social History   Socioeconomic History   Marital status: Married    Spouse name: Not on file   Number of children: Not on file   Years of education: Not on file   Highest education level: Not on file  Occupational History   Not on file  Tobacco Use   Smoking status: Never   Smokeless tobacco: Never  Vaping Use   Vaping status:  Never Used  Substance and Sexual Activity   Alcohol use: Not Currently    Alcohol/week: 0.0 - 5.0 standard drinks of alcohol    Comment: Social   Drug use: Never   Sexual activity: Yes    Birth control/protection: Surgical  Other Topics Concern   Not on file  Social History Narrative   Married. 2 children 66 and 76 year old daughters 04/2023. Oldest works in Recruitment consultant, youngest at [employer]- grad [employer].    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    Prior- Works in [employer]- one of top 7 folks under the AD.    Undergrad at [employer] at [employer]: cycling, rare golf. Enjoys the outdoors. Working in the yard.    Social Drivers of Corporate investment banker Strain: Not on file  Food Insecurity: Not on file  Transportation Needs: Not on file  Physical Activity: Not on file  Stress: Not on file  Social Connections: Not on file  Intimate Partner Violence: Not on file     Review of  Systems: General: negative for chills, fever, night sweats or weight changes.  Cardiovascular: negative for chest pain, dyspnea on exertion, edema, orthopnea, palpitations, paroxysmal nocturnal dyspnea or shortness of breath Dermatological: negative for rash Respiratory: negative for cough or wheezing Urologic: negative for hematuria Abdominal: negative for nausea, vomiting, diarrhea, bright red blood per rectum, melena, or hematemesis Neurologic: negative for visual changes, syncope, or dizziness All other systems reviewed and are otherwise negative except as noted above.    Blood pressure 118/76, pulse (!) 44, height 6' 2 (1.88 m), weight 207 lb 9.6 oz (94.2 kg), SpO2 95%.  General appearance: alert and no distress Neck: no adenopathy, no carotid  bruit, no JVD, supple, symmetrical, trachea midline, and thyroid  not enlarged, symmetric, no tenderness/mass/nodules Lungs: clear to auscultation bilaterally Heart: regular rate and rhythm, S1, S2 normal, no murmur, click, rub or gallop Extremities: extremities normal, atraumatic, no cyanosis or edema Pulses: 2+ and symmetric Skin: Skin color, texture, turgor normal. No rashes or lesions Neurologic: Grossly normal  EKG EKG Interpretation Date/Time:  Monday April 01 2024 09:33:53 EDT Ventricular Rate:  44 PR Interval:  202 QRS Duration:  108 QT Interval:  436 QTC Calculation: 372 R Axis:   44  Text Interpretation: Marked sinus bradycardia When compared with ECG of 10-Feb-2023 09:08, T wave amplitude has increased in Anterior leads Confirmed by Court Carrier [phone]) on 04/01/2024 10:03:30 AM    ASSESSMENT AND PLAN:   Elevated coronary artery calcium score Elevated coronary calcium score 72 performed 10/02/20 with majority being in the RCA.  He is completely asymptomatic.  He is currently not at goal for secondary prevention on diet alone.  I am going to repeat his coronary calcium score to assess for progression.  Thoracic aortic aneurysm Small ascending thoracic aortic aneurysm measuring 44 mm by 2D echo performed 02/16/2024.  This will be assessed on an annual basis.  Hyperlipidemia Recent lipid profile performed 11/10/2023 revealed total cholesterol 190, LDL of 96 and HDL of 82, not at goal for secondary prevention given his elevated coronary calcium score.  He is opposed to pharmacologic therapy.  We talked about lifestyle modification although he has a pretty clean diet.  We will repeat a lipid liver profile in 3 months.  If he still elevated will explore pharmacologic alternatives.     Carrier DOROTHA Court MD FACP,FACC,FAHA, [facility] 04/01/2024 10:13 AM

## 2024-04-01 NOTE — Assessment & Plan Note (Signed)
 Recent lipid profile performed 11/10/2023 revealed total cholesterol 190, LDL of 96 and HDL of 82, not at goal for secondary prevention given his elevated coronary calcium score.  He is opposed to pharmacologic therapy.  We talked about lifestyle modification although he has a pretty clean diet.  We will repeat a lipid liver profile in 3 months.  If he still elevated will explore pharmacologic alternatives.

## 2024-04-23 ENCOUNTER — Ambulatory Visit: Payer: Self-pay | Admitting: Cardiovascular Disease

## 2024-04-23 ENCOUNTER — Ambulatory Visit (HOSPITAL_COMMUNITY)
Admission: RE | Admit: 2024-04-23 | Discharge: 2024-04-23 | Disposition: A | Discharge status: Home / Self Care | Payer: Self-pay | Source: Ambulatory Visit | Attending: Cardiovascular Disease | Admitting: Cardiovascular Disease

## 2024-04-23 DIAGNOSIS — E782 Mixed hyperlipidemia: Secondary | ICD-10-CM

## 2024-04-23 DIAGNOSIS — R931 Abnormal findings on diagnostic imaging of heart and coronary circulation: Secondary | ICD-10-CM

## 2024-06-28 ENCOUNTER — Encounter: Payer: Self-pay | Admitting: Cardiovascular Disease

## 2024-07-10 LAB — HEPATIC FUNCTION PANEL
ALT: 34 IU/L (ref 0–44)
AST: 67 IU/L — ABNORMAL HIGH (ref 0–40)
Albumin: 4.8 g/dL (ref 4.1–5.1)
Alkaline Phosphatase: 54 IU/L (ref 47–123)
Bilirubin Total: 1 mg/dL (ref 0.0–1.2)
Bilirubin, Direct: 0.35 mg/dL (ref 0.00–0.40)
Total Protein: 6.7 g/dL (ref 6.0–8.5)

## 2024-07-10 LAB — LIPID PANEL
Chol/HDL Ratio: 1.9 ratio (ref 0.0–5.0)
Cholesterol, Total: 171 mg/dL (ref 100–199)
HDL: 91 mg/dL
LDL Chol Calc (NIH): 73 mg/dL (ref 0–99)
Triglycerides: 29 mg/dL (ref 0–149)
VLDL Cholesterol Cal: 7 mg/dL (ref 5–40)

## 2024-11-14 ENCOUNTER — Encounter: Admitting: Family Medicine

## 2025-01-30 ENCOUNTER — Other Ambulatory Visit (HOSPITAL_COMMUNITY)
